# Patient Record
Sex: Female | Born: 1980 | Race: Black or African American | Hispanic: No | Marital: Single | Smoking: Never smoker
Health system: Southern US, Community
[De-identification: ages and names within clinical notes are randomized; demographics above are authoritative.]

## PROBLEM LIST (undated history)

## (undated) ENCOUNTER — Inpatient Hospital Stay (HOSPITAL_COMMUNITY): Payer: Self-pay

## (undated) DIAGNOSIS — R51 Headache: Secondary | ICD-10-CM

## (undated) DIAGNOSIS — B999 Unspecified infectious disease: Secondary | ICD-10-CM

## (undated) DIAGNOSIS — B54 Unspecified malaria: Secondary | ICD-10-CM

## (undated) DIAGNOSIS — D219 Benign neoplasm of connective and other soft tissue, unspecified: Secondary | ICD-10-CM

## (undated) DIAGNOSIS — K219 Gastro-esophageal reflux disease without esophagitis: Secondary | ICD-10-CM

## (undated) HISTORY — PX: BREAST SURGERY: SHX581

---

## 2003-02-07 ENCOUNTER — Encounter: Admission: RE | Admit: 2003-02-07 | Discharge: 2003-02-07 | Payer: Self-pay | Admitting: Specialist

## 2005-04-28 ENCOUNTER — Emergency Department: Payer: Self-pay | Admitting: Emergency Medicine

## 2005-09-22 ENCOUNTER — Emergency Department (HOSPITAL_COMMUNITY): Admission: EM | Admit: 2005-09-22 | Discharge: 2005-09-23 | Payer: Self-pay | Admitting: Emergency Medicine

## 2005-12-02 ENCOUNTER — Encounter: Admission: RE | Admit: 2005-12-02 | Discharge: 2005-12-02 | Payer: Self-pay | Admitting: Internal Medicine

## 2008-12-09 ENCOUNTER — Emergency Department (HOSPITAL_COMMUNITY): Admission: EM | Admit: 2008-12-09 | Discharge: 2008-12-09 | Payer: Self-pay | Admitting: Emergency Medicine

## 2009-03-21 ENCOUNTER — Encounter: Admission: RE | Admit: 2009-03-21 | Discharge: 2009-03-21 | Payer: Self-pay | Admitting: Internal Medicine

## 2011-07-10 ENCOUNTER — Encounter (HOSPITAL_COMMUNITY): Payer: Self-pay | Admitting: *Deleted

## 2011-07-10 ENCOUNTER — Inpatient Hospital Stay (HOSPITAL_COMMUNITY)
Admission: AD | Admit: 2011-07-10 | Discharge: 2011-07-11 | Disposition: A | Discharge status: Home / Self Care | Payer: Medicaid Other | Source: Ambulatory Visit | Attending: Obstetrics & Gynecology | Admitting: Obstetrics & Gynecology

## 2011-07-10 DIAGNOSIS — O26859 Spotting complicating pregnancy, unspecified trimester: Secondary | ICD-10-CM

## 2011-07-10 DIAGNOSIS — R109 Unspecified abdominal pain: Secondary | ICD-10-CM | POA: Insufficient documentation

## 2011-07-10 DIAGNOSIS — N949 Unspecified condition associated with female genital organs and menstrual cycle: Secondary | ICD-10-CM

## 2011-07-10 DIAGNOSIS — O26852 Spotting complicating pregnancy, second trimester: Secondary | ICD-10-CM

## 2011-07-10 DIAGNOSIS — R350 Frequency of micturition: Secondary | ICD-10-CM | POA: Insufficient documentation

## 2011-07-10 LAB — WET PREP, GENITAL
Clue Cells Wet Prep HPF POC: NONE SEEN
Trich, Wet Prep: NONE SEEN
Yeast Wet Prep HPF POC: NONE SEEN

## 2011-07-10 LAB — CBC
HCT: 33.3 % — ABNORMAL LOW (ref 36.0–46.0)
Hemoglobin: 11.3 g/dL — ABNORMAL LOW (ref 12.0–15.0)
MCH: 31 pg (ref 26.0–34.0)
MCHC: 33.9 g/dL (ref 30.0–36.0)
MCV: 91.5 fL (ref 78.0–100.0)
Platelets: 281 10*3/uL (ref 150–400)
RBC: 3.64 MIL/uL — ABNORMAL LOW (ref 3.87–5.11)
RDW: 13.2 % (ref 11.5–15.5)
WBC: 9 10*3/uL (ref 4.0–10.5)

## 2011-07-10 LAB — ABO/RH: ABO/RH(D): O POS

## 2011-07-10 NOTE — MAU Note (Signed)
Pt bleeding when she wiped at 2045. Pt also reports cramping which has ben going on for weeks. Pt also reports that she is constipated, but had normal BM yesterday.

## 2011-07-10 NOTE — MAU Note (Signed)
Pt states, " I've had constipation during my pregnancy; I went to the bathroom yesterday but it was hard. I've had cramping in my low abdomen for more than a week. I starting spotting tonight."

## 2011-07-10 NOTE — MAU Provider Note (Signed)
History     CSN: [id number]  Arrival date and time: 07/10/11 2121   First Provider Initiated Contact with Patient 07/10/11 2245      Chief Complaint  Patient presents with  . Vaginal Bleeding  . Abdominal Cramping   HPI 31 y.o. G1P0 at [redacted]w[redacted]d. Reports bleeding with wiping today, not heavy. Ongoing mild intermittent crampy pain. Has appt with Dr. Tamela Oddi next week.    Past Medical History  Diagnosis Date  . No pertinent past medical history     Past Surgical History  Procedure Date  . Breast surgery     R Breast    Family History  Problem Relation Age of Onset  . Anesthesia problems Neg Hx     History  Substance Use Topics  . Smoking status: Never Smoker   . Smokeless tobacco: Not on file  . Alcohol Use: No    Allergies: No Known Allergies  No prescriptions prior to admission    Review of Systems  Constitutional: Negative.   Respiratory: Negative.   Cardiovascular: Negative.   Gastrointestinal: Positive for abdominal pain. Negative for nausea, vomiting, diarrhea and constipation.  Genitourinary: Negative for dysuria, urgency, frequency, hematuria and flank pain.       Positive for vaginal bleeding   Musculoskeletal: Negative.   Neurological: Negative.   Psychiatric/Behavioral: Negative.    Physical Exam   Blood pressure 130/75, pulse 98, temperature 98.1 F (36.7 C), temperature source Oral, resp. rate 18, height 5' 5.5" (1.664 m), weight 153 lb 8 oz (69.627 kg), last menstrual period 03/12/2011.  Physical Exam  Nursing note and vitals reviewed. Constitutional: She is oriented to person, place, and time. She appears well-developed and well-nourished. No distress.  HENT:  Head: Normocephalic and atraumatic.  Cardiovascular: Normal rate.   Respiratory: Effort normal.  GI: Soft. There is no tenderness.  Genitourinary: There is no rash or lesion on the right labia. There is no rash or lesion on the left labia. Uterus is not tender. Enlarged:  Size c/w dates. Cervix exhibits no motion tenderness, no discharge and no friability. Right adnexum displays no mass, no tenderness and no fullness. Left adnexum displays no mass, no tenderness and no fullness. No tenderness or bleeding around the vagina. No vaginal discharge found.  Musculoskeletal: Normal range of motion.  Neurological: She is alert and oriented to person, place, and time.  Skin: Skin is warm and dry.  Psychiatric: She has a normal mood and affect.   + FHT MAU Course  Procedures  Results for orders placed during the hospital encounter of 07/10/11 (from the past 24 hour(s))  URINALYSIS, ROUTINE W REFLEX MICROSCOPIC     Status: Abnormal   Collection Time   07/10/11 10:00 PM      Component Value Range   Color, Urine YELLOW  YELLOW    APPearance CLEAR  CLEAR    Specific Gravity, Urine <1.005 (*) 1.005 - 1.030    pH 7.0  5.0 - 8.0    Glucose, UA NEGATIVE  NEGATIVE (mg/dL)   Hgb urine dipstick TRACE (*) NEGATIVE    Bilirubin Urine NEGATIVE  NEGATIVE    Ketones, ur NEGATIVE  NEGATIVE (mg/dL)   Protein, ur NEGATIVE  NEGATIVE (mg/dL)   Urobilinogen, UA 0.2  0.0 - 1.0 (mg/dL)   Nitrite NEGATIVE  NEGATIVE    Leukocytes, UA NEGATIVE  NEGATIVE   URINE MICROSCOPIC-ADD ON     Status: Abnormal   Collection Time   07/10/11 10:00 PM      Component  Value Range   Squamous Epithelial / LPF FEW (*) RARE    WBC, UA 0-2  <3 (WBC/hpf)   RBC / HPF    <3 (RBC/hpf)   Value: NO FORMED ELEMENTS SEEN ON URINE MICROSCOPIC EXAMINATION  CBC     Status: Abnormal   Collection Time   07/10/11 10:16 PM      Component Value Range   WBC 9.0  4.0 - 10.5 (K/uL)   RBC 3.64 (*) 3.87 - 5.11 (MIL/uL)   Hemoglobin 11.3 (*) 12.0 - 15.0 (g/dL)   HCT 16.1 (*) 09.6 - 46.0 (%)   MCV 91.5  78.0 - 100.0 (fL)   MCH 31.0  26.0 - 34.0 (pg)   MCHC 33.9  30.0 - 36.0 (g/dL)   RDW 04.5  40.9 - 81.1 (%)   Platelets 281  150 - 400 (K/uL)  ABO/RH     Status: Normal   Collection Time   07/10/11 10:16 PM       Component Value Range   ABO/RH(D) O POS    WET PREP, GENITAL     Status: Abnormal   Collection Time   07/10/11 10:45 PM      Component Value Range   Yeast Wet Prep HPF POC NONE SEEN  NONE SEEN    Trich, Wet Prep NONE SEEN  NONE SEEN    Clue Cells Wet Prep HPF POC NONE SEEN  NONE SEEN    WBC, Wet Prep HPF POC MODERATE (*) NONE SEEN      Assessment and Plan  31 y.o. G1P0 at [redacted]w[redacted]d Bleeding in pregnancy GC/CT pending Care assumed by Ivonne Andrew, CNM   FRAZIER,NATALIE 07/10/2011, 10:45 PM   Care of pt assumed at 2300  No further bleeding. Mild, bilat, short, sharp groin pains w/ mvmt.  1. Spotting complicating pregnancy in second trimester   2. Frequency of urination   3. Round ligament pain    Follow-up Information    Follow up with [facility] on 07/17/2011. (or MAU as needed if symptoms worsen)    Contact information:   [address] [place] [address]         Medication List  As of 07/11/2011 12:31 AM   CONTINUE taking these medications         acetaminophen 500 MG tablet   Commonly known as: TYLENOL      prenatal multivitamin Tabs           Will culture urine due to reports of frequency. Bleeding precautions Pelvic rest until NOB appointment.  Dorathy Kinsman 07/11/2011 12:32 AM

## 2011-07-11 LAB — URINE MICROSCOPIC-ADD ON: RBC / HPF: NONE SEEN RBC/hpf (ref <3)

## 2011-07-11 LAB — URINALYSIS, ROUTINE W REFLEX MICROSCOPIC
Bilirubin Urine: NEGATIVE
Glucose, UA: NEGATIVE mg/dL
Ketones, ur: NEGATIVE mg/dL
Leukocytes, UA: NEGATIVE
Nitrite: NEGATIVE
Protein, ur: NEGATIVE mg/dL
Specific Gravity, Urine: <1.005 — ABNORMAL LOW (ref 1.005–1.030)
Urobilinogen, UA: 0.2 mg/dL (ref 0.0–1.0)
pH: 7 (ref 5.0–8.0)

## 2011-07-11 NOTE — MAU Provider Note (Signed)
Medical Screening exam and patient care preformed by advanced practice provider.  Agree with the above management.  

## 2011-07-12 LAB — URINE CULTURE
Colony Count: 40000
Culture  Setup Time: 201304270547
Special Requests: NORMAL

## 2011-07-13 LAB — GC/CHLAMYDIA PROBE AMP, GENITAL
Chlamydia, DNA Probe: NEGATIVE
GC Probe Amp, Genital: NEGATIVE

## 2011-07-17 ENCOUNTER — Other Ambulatory Visit: Payer: Self-pay | Admitting: Obstetrics & Gynecology

## 2011-07-17 DIAGNOSIS — Z3689 Encounter for other specified antenatal screening: Secondary | ICD-10-CM

## 2011-07-21 ENCOUNTER — Ambulatory Visit (HOSPITAL_COMMUNITY)
Admission: RE | Admit: 2011-07-21 | Discharge: 2011-07-21 | Disposition: A | Discharge status: Home / Self Care | Payer: Medicaid Other | Source: Ambulatory Visit | Attending: Obstetrics & Gynecology | Admitting: Obstetrics & Gynecology

## 2011-07-21 DIAGNOSIS — Z363 Encounter for antenatal screening for malformations: Secondary | ICD-10-CM | POA: Insufficient documentation

## 2011-07-21 DIAGNOSIS — O358XX0 Maternal care for other (suspected) fetal abnormality and damage, not applicable or unspecified: Secondary | ICD-10-CM | POA: Insufficient documentation

## 2011-07-21 DIAGNOSIS — Z1389 Encounter for screening for other disorder: Secondary | ICD-10-CM | POA: Insufficient documentation

## 2011-07-21 DIAGNOSIS — Z3689 Encounter for other specified antenatal screening: Secondary | ICD-10-CM

## 2011-07-28 ENCOUNTER — Encounter: Payer: Self-pay | Admitting: Advanced Practice Midwife

## 2011-07-28 DIAGNOSIS — B951 Streptococcus, group B, as the cause of diseases classified elsewhere: Secondary | ICD-10-CM

## 2011-07-28 DIAGNOSIS — O2340 Unspecified infection of urinary tract in pregnancy, unspecified trimester: Secondary | ICD-10-CM | POA: Insufficient documentation

## 2011-07-28 MED ORDER — AMOXICILLIN 875 MG PO TABS: 875.0000 mg | ORAL_TABLET | Freq: Two times a day (BID) | ORAL | Status: AC

## 2011-09-07 ENCOUNTER — Inpatient Hospital Stay (HOSPITAL_COMMUNITY)
Admission: AD | Admit: 2011-09-07 | Discharge: 2011-09-07 | Disposition: A | Discharge status: Home / Self Care | Payer: Medicaid Other | Source: Ambulatory Visit | Attending: Obstetrics | Admitting: Obstetrics

## 2011-09-07 ENCOUNTER — Inpatient Hospital Stay (HOSPITAL_COMMUNITY): Payer: Medicaid Other

## 2011-09-07 ENCOUNTER — Encounter (HOSPITAL_COMMUNITY): Payer: Self-pay | Admitting: *Deleted

## 2011-09-07 DIAGNOSIS — Z331 Pregnant state, incidental: Secondary | ICD-10-CM

## 2011-09-07 DIAGNOSIS — N930 Postcoital and contact bleeding: Secondary | ICD-10-CM

## 2011-09-07 DIAGNOSIS — O209 Hemorrhage in early pregnancy, unspecified: Secondary | ICD-10-CM | POA: Insufficient documentation

## 2011-09-07 HISTORY — DX: Benign neoplasm of connective and other soft tissue, unspecified: D21.9

## 2011-09-07 NOTE — MAU Provider Note (Signed)
Chief Complaint:  Vaginal Bleeding    First Provider Initiated Contact with Patient 09/07/11 1627      Shirleyann Widrig is  31 y.o. G1P0000.  Patient's last menstrual period was 03/12/2011..  [redacted]w[redacted]d  She presents complaining of Vaginal Bleeding . Onset is described as sudden and has been present for  3 hours. Reports intercourse last night. Noticed bright red vaginal bleeding after voiding today. Denies abd pain, LOF, dysuria or back pain. + FM  Obstetrical/Gynecological History: OB History    Grav Para Term Preterm Abortions TAB SAB Ect Mult Living   1 0 0 0 0 0 0 0 0 0       Past Medical History: Past Medical History  Diagnosis Date  . Fibroid     4CM anterior    Past Surgical History: Past Surgical History  Procedure Date  . Breast surgery     R Breast    Family History: Family History  Problem Relation Age of Onset  . Anesthesia problems Neg Hx     Social History: History  Substance Use Topics  . Smoking status: Never Smoker   . Smokeless tobacco: Never Used  . Alcohol Use: No    Allergies: No Known Allergies  Prescriptions prior to admission  Medication Sig Dispense Refill  . acetaminophen (TYLENOL) 500 MG tablet Take 500 mg by mouth every 6 (six) hours as needed. For pain, headache      . lansoprazole (PREVACID) 15 MG capsule Take 15 mg by mouth daily. Acid reflux      . Prenatal Vit-Fe Fumarate-FA (PRENATAL MULTIVITAMIN) TABS Take 1 tablet by mouth daily.      . promethazine (PHENERGAN) 25 MG tablet Take 25 mg by mouth every 6 (six) hours as needed. nausea        Review of Systems - History obtained from the patient General ROS: negative for - chills or fever Respiratory ROS: no cough, shortness of breath, or wheezing Cardiovascular ROS: no chest pain or dyspnea on exertion Gastrointestinal ROS: no abdominal pain, change in bowel habits, or black or bloody stools Genito-Urinary ROS: no dysuria, trouble voiding, or hematuria positive for - vaginal  bleeding  Physical Exam   Blood pressure 123/65, pulse 96, temperature 97.8 F (36.6 C), temperature source Oral, resp. rate 20, height 5\' 6"  (1.676 m), weight 159 lb (72.122 kg), last menstrual period 03/12/2011.  General: General appearance - alert, well appearing, and in no distress and oriented to person, place, and time Mental status - alert, oriented to person, place, and time, normal mood, behavior, speech, dress, motor activity, and thought processes, anxious Abdomen - gravid non tender Focused Gynecological Exam: VULVA: normal appearing vulva with no masses, tenderness or lesions, VAGINA: normal appearing vagina with normal color and discharge, no lesions, atrophic, small amount of BRB cleared from vault, CERVIX: friable to touch, hemostatic with pressure, closes/thick/firm/long  Labs: No results found for this or any previous visit (from the past 24 hour(s)). Imaging Studies:  US Ob Limited  09/07/2011  *RADIOLOGY REPORT*  Clinical Data: Vaginal bleeding.  Fibroids.  25-week-4-day assigned gestational age.  LIMITED OBSTETRIC ULTRASOUND  Number of Fetuses: 1 Heart Rate: 152 bpm Movement: Yes Presentation: Cephalic Placental Location: Fundal Previa: No Amniotic Fluid (Subjective): Within normal limits  Vertical pocket:  4.8cm  BPD: Six pointcm   25w   4d   EDC: 12/17/2011  MATERNAL FINDINGS: Cervix: 4.7 cm; appears closed Uterus/Adnexae: Anterior fibroid again seen, measuring 4.0 cm in maximum diameter.  IMPRESSION:  1.  Single living intrauterine fetus in cephalic presentation. 2.  Normal amniotic fluid volume. 3.  No evidence of placental abruption or previa. 4.  4 cm anterior fibroid again noted.  Original Report Authenticated By: Danae Orleans, M.D.   MD Consult: discussed with Dr. Gaynell Face  Assessment: 1. Post - coital bleeding   2. Pregnant state, incidental    Plan: Discharge home Precautions reviewed FU with Dr. Clearance Coots in 1 week  Codylee Patil E. 09/07/2011,6:07 PM

## 2011-09-07 NOTE — MAU Note (Addendum)
Noted sudden onset of vaginal bleeding around 1500. Has not occurred before. Denies previa or low lying placenta. States she had intercourse last night. Denies pain.

## 2011-09-07 NOTE — Discharge Instructions (Signed)
Vaginal Bleeding During Pregnancy, Second Trimester  A small amount of bleeding (spotting) is relatively common in pregnancy. It usually stops on its own. There are many causes for bleeding or spotting in pregnancy. Some bleeding may be related to the pregnancy and some may not. Cramping with the bleeding is more serious and concerning. Tell your caregiver if you have any vaginal bleeding.   CAUSES    Infection, inflammation or growths on the cervix.   The placenta may partially or completely be covering the opening of the cervix inside the uterus.   The placenta may have separated from the uterus.   You may be having early/preterm labor.   The cervix is not strong enough to keep a baby inside the uterus (cervical insufficiency).   Many tiny cysts in the uterus instead of pregnancy tissue (molar pregnancy)  SYMPTOMS    Vaginal spotting or bleeding with or without cramps.   Uterine contractions.   Abnormal vaginal discharge.   You may have spotting or spotting after having sexual intercourse.  DIAGNOSIS   To evaluate the pregnancy, your caregiver may:   Do a pelvic exam.   Take blood tests.   Do an ultrasound.  It is very important to follow your caregiver's instructions.   TREATMENT    Evaluation of the pregnancy with blood tests and ultrasound.   Bed rest (getting up to use the bathroom only).   Rho-gam immunization if the mother is Rh negative and the father is Rh positive.   If you are having uterine contractions, you may be given medication to stop the contractions.   If you have cervical insufficiency, you may have a suture placed in the cervix to close it.  HOME CARE INSTRUCTIONS    If your caregiver orders bed rest, you may need to make arrangements for the care of other children and for any other responsibilities. However, your caregiver may allow you to continue light activity.   Keep track of the number of pads you use each day and how soaked (saturated) they are. Write this down.   Do  not use tampons. Do not douche.   Do not have sexual intercourse or orgasms until approved by your physician.   Save any tissue that you pass for your caregiver to see.   Take medicine for cramps only with your caregiver's permission.   Do not take aspirin because it can make you bleed.   Do not exercise, do any strenuous activities or heavy lifting without your caregiver's permission.  SEEK IMMEDIATE MEDICAL CARE IF:    You experience severe cramps in your stomach, back or belly (abdomen).   You have uterine contractions.   You have an oral temperature above 102 F (38.9 C), not controlled by medicine.   You develop chills.   You pass large clots or tissue.   Your bleeding increases or you become light-headed, weak or have fainting episodes.   You have leaking or a gush of fluid from your vagina.  Document Released: 12/10/2004 Document Revised: 02/19/2011 Document Reviewed: 06/21/2008  [employer] Patient Information 2012 [employer].

## 2011-10-15 ENCOUNTER — Other Ambulatory Visit: Payer: Self-pay | Admitting: Obstetrics

## 2011-10-15 DIAGNOSIS — Z09 Encounter for follow-up examination after completed treatment for conditions other than malignant neoplasm: Secondary | ICD-10-CM

## 2011-10-15 DIAGNOSIS — O365990 Maternal care for other known or suspected poor fetal growth, unspecified trimester, not applicable or unspecified: Secondary | ICD-10-CM

## 2011-10-16 ENCOUNTER — Ambulatory Visit (HOSPITAL_COMMUNITY)
Admission: RE | Admit: 2011-10-16 | Discharge: 2011-10-16 | Disposition: A | Discharge status: Home / Self Care | Payer: Medicaid Other | Source: Ambulatory Visit | Attending: Obstetrics | Admitting: Obstetrics

## 2011-10-16 DIAGNOSIS — Z3689 Encounter for other specified antenatal screening: Secondary | ICD-10-CM | POA: Insufficient documentation

## 2011-10-16 DIAGNOSIS — Z09 Encounter for follow-up examination after completed treatment for conditions other than malignant neoplasm: Secondary | ICD-10-CM

## 2011-12-22 ENCOUNTER — Inpatient Hospital Stay (HOSPITAL_COMMUNITY)
Admission: AD | Admit: 2011-12-22 | Discharge: 2011-12-27 | DRG: 766 | Disposition: A | Discharge status: Home / Self Care | Payer: Medicaid Other | Source: Ambulatory Visit | Attending: Obstetrics | Admitting: Obstetrics

## 2011-12-22 ENCOUNTER — Encounter (HOSPITAL_COMMUNITY): Payer: Self-pay | Admitting: *Deleted

## 2011-12-22 DIAGNOSIS — O4290 Premature rupture of membranes, unspecified as to length of time between rupture and onset of labor, unspecified weeks of gestation: Secondary | ICD-10-CM | POA: Diagnosis present

## 2011-12-22 DIAGNOSIS — B951 Streptococcus, group B, as the cause of diseases classified elsewhere: Secondary | ICD-10-CM

## 2011-12-22 DIAGNOSIS — Z22330 Carrier of Group B streptococcus: Secondary | ICD-10-CM

## 2011-12-22 DIAGNOSIS — O99892 Other specified diseases and conditions complicating childbirth: Secondary | ICD-10-CM | POA: Diagnosis present

## 2011-12-22 HISTORY — DX: Unspecified malaria: B54

## 2011-12-22 HISTORY — DX: Unspecified infectious disease: B99.9

## 2011-12-22 HISTORY — DX: Headache: R51

## 2011-12-22 HISTORY — DX: Gastro-esophageal reflux disease without esophagitis: K21.9

## 2011-12-22 LAB — TYPE AND SCREEN
ABO/RH(D): O POS
Antibody Screen: NEGATIVE

## 2011-12-22 LAB — CBC
MCH: 31.6 pg (ref 26.0–34.0)
MCV: 93 fL (ref 78.0–100.0)
Platelets: 292 10*3/uL (ref 150–400)
RBC: 4.31 MIL/uL (ref 3.87–5.11)
RDW: 13.2 % (ref 11.5–15.5)
WBC: 9.3 10*3/uL (ref 4.0–10.5)

## 2011-12-22 LAB — COMPREHENSIVE METABOLIC PANEL
Albumin: 3.1 g/dL — ABNORMAL LOW (ref 3.5–5.2)
Alkaline Phosphatase: 124 U/L — ABNORMAL HIGH (ref 39–117)
BUN: 10 mg/dL (ref 6–23)
Calcium: 9.2 mg/dL (ref 8.4–10.5)
GFR calc Af Amer: >90 mL/min (ref >90)
Glucose, Bld: 73 mg/dL (ref 70–99)
Potassium: 4.2 mEq/L (ref 3.5–5.1)
Total Protein: 6.7 g/dL (ref 6.0–8.3)

## 2011-12-22 LAB — LACTATE DEHYDROGENASE: LDH: 218 U/L (ref 94–250)

## 2011-12-22 LAB — URIC ACID: Uric Acid, Serum: 5.4 mg/dL (ref 2.4–7.0)

## 2011-12-22 LAB — POCT FERN TEST

## 2011-12-22 LAB — RPR: RPR Ser Ql: NONREACTIVE

## 2011-12-22 MED ORDER — PENICILLIN G POTASSIUM 5000000 UNITS IJ SOLR
2.5000 10*6.[IU] | INTRAVENOUS | Status: DC
  Administered 2011-12-22 – 2011-12-24 (×10): 2.5 10*6.[IU] via INTRAVENOUS
  Filled 2011-12-22 (×13): qty 2.5

## 2011-12-22 MED ORDER — ONDANSETRON HCL 4 MG/2ML IJ SOLN: 4.0000 mg | Freq: Four times a day (QID) | INTRAMUSCULAR | Status: DC | PRN

## 2011-12-22 MED ORDER — LACTATED RINGERS IV SOLN
INTRAVENOUS | Status: DC
  Administered 2011-12-22 – 2011-12-24 (×5): via INTRAVENOUS

## 2011-12-22 MED ORDER — CITRIC ACID-SODIUM CITRATE 334-500 MG/5ML PO SOLN
30.0000 mL | ORAL | Status: DC | PRN
  Administered 2011-12-24: 30 mL via ORAL

## 2011-12-22 MED ORDER — OXYTOCIN 40 UNITS IN LACTATED RINGERS INFUSION - SIMPLE MED
1.0000 m[IU]/min | INTRAVENOUS | Status: DC
  Administered 2011-12-22: 2 m[IU]/min via INTRAVENOUS
  Filled 2011-12-22: qty 1000

## 2011-12-22 MED ORDER — PENICILLIN G POTASSIUM 5000000 UNITS IJ SOLR
5.0000 10*6.[IU] | Freq: Once | INTRAVENOUS | Status: AC
  Administered 2011-12-22: 5 10*6.[IU] via INTRAVENOUS
  Filled 2011-12-22: qty 5

## 2011-12-22 MED ORDER — LIDOCAINE HCL (PF) 1 % IJ SOLN: 30.0000 mL | INTRAMUSCULAR | Status: DC | PRN

## 2011-12-22 MED ORDER — TERBUTALINE SULFATE 1 MG/ML IJ SOLN: 0.2500 mg | Freq: Once | INTRAMUSCULAR | Status: AC | PRN

## 2011-12-22 MED ORDER — ACETAMINOPHEN 325 MG PO TABS: 650.0000 mg | ORAL_TABLET | ORAL | Status: DC | PRN

## 2011-12-22 MED ORDER — OXYCODONE-ACETAMINOPHEN 5-325 MG PO TABS
1.0000 | ORAL_TABLET | ORAL | Status: DC | PRN
Start: 2011-12-22 — End: 2011-12-24

## 2011-12-22 MED ORDER — IBUPROFEN 600 MG PO TABS: 600.0000 mg | ORAL_TABLET | Freq: Four times a day (QID) | ORAL | Status: DC | PRN

## 2011-12-22 MED ORDER — LACTATED RINGERS IV SOLN
500.0000 mL | INTRAVENOUS | Status: DC | PRN
Start: 2011-12-22 — End: 2011-12-24

## 2011-12-22 MED ORDER — OXYTOCIN BOLUS FROM INFUSION
500.0000 mL | Freq: Once | INTRAVENOUS | Status: DC
  Filled 2011-12-22: qty 500

## 2011-12-22 MED ORDER — OXYTOCIN 40 UNITS IN LACTATED RINGERS INFUSION - SIMPLE MED: 62.5000 mL/h | Freq: Once | INTRAVENOUS | Status: DC

## 2011-12-22 NOTE — MAU Note (Signed)
Dr. Clearance Coots notified of pt in MAU for eval of ?SROM, +fern, GBS +, cervix 1.5/60/-2 vertex, orders to admit, Dr. Clearance Coots to place orders for PCN protocol.

## 2011-12-22 NOTE — MAU Note (Signed)
Clear fluid first noted at 0800, no bleeding.  First preg, uncomplicated.  Rare pains, about every 3 hrs past couple days.  Was 2 cm last wk.

## 2011-12-22 NOTE — MAU Note (Signed)
Dr. Clearance Coots notified pt had elevated b/p's earlier in MAU, b/p's reported by RN, Dr. Clearance Coots gave orders for [facility] labs. CMandeleras, RN notified, will pass on to RN providing care for pt.

## 2011-12-22 NOTE — H&P (Signed)
Courtney Johns is a 31 y.o. female presenting for ROM. Maternal Medical History:  Reason for admission: Reason for admission: rupture of membranes and contractions.  31 yo G1.  EDC 12-17-11.  Presents with c/o leaking clear fluid since 0800.  Irregular UC's.  GBS +  Contractions: Onset was 3-5 hours ago.   Frequency: irregular.    Fetal activity: Perceived fetal activity is normal.   Last perceived fetal movement was within the past hour.    Prenatal complications: no prenatal complications Prenatal Complications - Diabetes: none.    OB History    Grav Para Term Preterm Abortions TAB SAB Ect Mult Living   1 0 0 0 0 0 0 0 0 0      Past Medical History  Diagnosis Date  . Fibroid     4CM anterior  . Headache   . GERD (gastroesophageal reflux disease)   . Infection     urinary tract infection   Past Surgical History  Procedure Date  . Breast surgery     R Breast   Family History: family history is negative for Anesthesia problems and Other. Social History:  reports that she has never smoked. She has never used smokeless tobacco. She reports that she does not drink alcohol or use illicit drugs.   Prenatal Transfer Tool  Maternal Diabetes: No Genetic Screening: Normal Maternal Ultrasounds/Referrals: Normal Fetal Ultrasounds or other Referrals:  None Maternal Substance Abuse:  No Significant Maternal Medications:  Meds include: Other:       PNV                                                                                                                                                                                                             Significant Maternal Lab Results:  Lab values include: Group B Strep positive Other Comments:  None  Review of Systems  All other systems reviewed and are negative.      Blood pressure 131/95, pulse 86, temperature 97.1 F (36.2 C), temperature source Oral, resp. rate 20, last menstrual period 03/12/2011. Maternal Exam:  Uterine  Assessment: Contraction strength is mild.  Abdomen: Patient reports no abdominal tenderness. Fetal presentation: vertex  Introitus: Normal vulva. Normal vagina.  Pelvis: adequate for delivery.   Cervix: Cervix evaluated by digital exam.     Physical Exam  Nursing note and vitals reviewed. Constitutional: She is oriented to person, place, and time. She appears well-developed and well-nourished.  HENT:  Head: Normocephalic and atraumatic.  Eyes: Conjunctivae normal are normal. Pupils are equal, round,  and reactive to light.  Neck: Normal range of motion. Neck supple.  Cardiovascular: Normal rate and regular rhythm.   Respiratory: Effort normal.  GI: Soft.  Genitourinary: Vagina normal and uterus normal.  Musculoskeletal: Normal range of motion.  Neurological: She is alert and oriented to person, place, and time.  Skin: Skin is warm and dry.  Psychiatric: She has a normal mood and affect. Her behavior is normal. Judgment and thought content normal.    Prenatal labs: ABO, Rh: --/--/O POS (04/26 2216) Antibody:   Rubella:   RPR:    HBsAg:    HIV:    GBS:     Assessment/Plan: 40.5 weeks.  SROM.  Latent phase of labor.   Eivin Mascio A 12/22/2011, 11:55 AM

## 2011-12-22 NOTE — MAU Note (Signed)
Pt states srom at 0800, clear fluid. Was dilated 2cm last Wednesday.

## 2011-12-23 DIAGNOSIS — O4290 Premature rupture of membranes, unspecified as to length of time between rupture and onset of labor, unspecified weeks of gestation: Secondary | ICD-10-CM | POA: Diagnosis present

## 2011-12-23 LAB — OB RESULTS CONSOLE RUBELLA ANTIBODY, IGM: Rubella: IMMUNE

## 2011-12-23 MED ORDER — NALBUPHINE SYRINGE 5 MG/0.5 ML
10.0000 mg | INJECTION | Freq: Four times a day (QID) | INTRAMUSCULAR | Status: DC | PRN
  Administered 2011-12-23 – 2011-12-24 (×2): 10 mg via INTRAMUSCULAR
  Filled 2011-12-23 (×2): qty 1

## 2011-12-23 MED ORDER — EPHEDRINE 5 MG/ML INJ: 10.0000 mg | INTRAVENOUS | Status: DC | PRN

## 2011-12-23 MED ORDER — EPHEDRINE 5 MG/ML INJ
10.0000 mg | INTRAVENOUS | Status: DC | PRN
  Filled 2011-12-23: qty 4

## 2011-12-23 MED ORDER — LACTATED RINGERS IV SOLN: 500.0000 mL | Freq: Once | INTRAVENOUS | Status: DC

## 2011-12-23 MED ORDER — INFLUENZA VIRUS VACC SPLIT PF IM SUSP: 0.5000 mL | INTRAMUSCULAR | Status: DC | PRN

## 2011-12-23 MED ORDER — PHENYLEPHRINE 40 MCG/ML (10ML) SYRINGE FOR IV PUSH (FOR BLOOD PRESSURE SUPPORT)
80.0000 ug | PREFILLED_SYRINGE | INTRAVENOUS | Status: DC | PRN
  Filled 2011-12-23: qty 5

## 2011-12-23 MED ORDER — PROMETHAZINE HCL 25 MG/ML IJ SOLN
25.0000 mg | Freq: Four times a day (QID) | INTRAMUSCULAR | Status: DC | PRN
  Administered 2011-12-23: 25 mg via INTRAMUSCULAR
  Filled 2011-12-23: qty 1

## 2011-12-23 MED ORDER — DIPHENHYDRAMINE HCL 50 MG/ML IJ SOLN: 12.5000 mg | INTRAMUSCULAR | Status: DC | PRN

## 2011-12-23 MED ORDER — FENTANYL 2.5 MCG/ML BUPIVACAINE 1/10 % EPIDURAL INFUSION (WH - ANES)
14.0000 mL/h | INTRAMUSCULAR | Status: DC
  Administered 2011-12-24: 14 mL/h via EPIDURAL
  Filled 2011-12-23 (×2): qty 125

## 2011-12-23 MED ORDER — PHENYLEPHRINE 40 MCG/ML (10ML) SYRINGE FOR IV PUSH (FOR BLOOD PRESSURE SUPPORT): 80.0000 ug | PREFILLED_SYRINGE | INTRAVENOUS | Status: DC | PRN

## 2011-12-23 MED ORDER — OXYTOCIN 40 UNITS IN LACTATED RINGERS INFUSION - SIMPLE MED
1.0000 m[IU]/min | INTRAVENOUS | Status: DC
  Administered 2011-12-23: 2 m[IU]/min via INTRAVENOUS
  Filled 2011-12-23: qty 1000

## 2011-12-23 MED ORDER — NALBUPHINE SYRINGE 5 MG/0.5 ML
10.0000 mg | INJECTION | INTRAMUSCULAR | Status: DC | PRN
Start: 2011-12-23 — End: 2011-12-24
  Administered 2011-12-23: 10 mg via INTRAVENOUS
  Filled 2011-12-23 (×2): qty 1

## 2011-12-23 NOTE — Progress Notes (Signed)
Called Dr Tamela Oddi to update that pt thinking about epidural for pain mgmt. Cervical exam unchanged, will be on Pitocin 24 hours @ 1830. D/c Pitocin then restart x 2 hours. Pt may have epidural.

## 2011-12-23 NOTE — Progress Notes (Signed)
Called Dr Jackson Moore to update that pt thinking about epidural for pain mgmt. Cervical exam unchanged, will be on Pitocin 24 hours @ 1830. D/c Pitocin then restart x 2 hours. Pt may have epidural.  

## 2011-12-23 NOTE — Progress Notes (Signed)
Courtney Johns is a 31 y.o. G1P0000 at [redacted]w[redacted]d by LMP admitted for PROM  Subjective: Comfortable  Objective: BP 118/80  Pulse 93  Temp 97.7 F (36.5 C) (Oral)  Resp 18  Ht 5\' 6"  (1.676 m)  Wt 172 lb (78.019 kg)  BMI 27.76 kg/m2  LMP 03/12/2011      FHT:  FHR: 140 bpm, variability: moderate,  accelerations:  Present,  decelerations:  Absent UC:   irregular, every 5 minutes SVE:   Dilation: 1 Effacement (%): 50;60 Station: -2 Exam by:: R Simpson RN  Labs: Lab Results  Component Value Date   WBC 9.3 12/22/2011   HGB 13.6 12/22/2011   HCT 40.1 12/22/2011   MCV 93.0 12/22/2011   PLT 292 12/22/2011    Assessment / Plan: Protracted latent phase  Labor: see above Preeclampsia:  n/a Fetal Wellbeing:  Category I Pain Control:  Labor support without medications I/D:  n/a Anticipated MOD:  NSVD  JACKSON-MOORE,Elenor Wildes A 12/23/2011, 8:28 PM

## 2011-12-23 NOTE — Anesthesia Preprocedure Evaluation (Signed)
Anesthesia Evaluation  Patient identified by MRN, date of birth, ID band Patient awake    Reviewed: Allergy & Precautions, H&P , Patient's Chart, lab work & pertinent test results  Airway Mallampati: II  TM Distance: >3 FB Neck ROM: full    Dental  (+) Teeth Intact   Pulmonary  breath sounds clear to auscultation        Cardiovascular Rhythm:regular Rate:Normal     Neuro/Psych    GI/Hepatic GERD-  ,  Endo/Other    Renal/GU      Musculoskeletal   Abdominal   Peds  Hematology   Anesthesia Other Findings       Reproductive/Obstetrics (+) Pregnancy                             Anesthesia Physical Anesthesia Plan  ASA: II  Anesthesia Plan: Epidural   Post-op Pain Management:    Induction:   Airway Management Planned:   Additional Equipment:   Intra-op Plan:   Post-operative Plan:   Informed Consent: I have reviewed the patients History and Physical, chart, labs and discussed the procedure including the risks, benefits and alternatives for the proposed anesthesia with the patient or authorized representative who has indicated his/her understanding and acceptance.   Dental Advisory Given  Plan Discussed with:   Anesthesia Plan Comments: (Labs checked- platelets confirmed with RN in room. Fetal heart tracing, per RN, reported to be stable enough for sitting procedure. Discussed epidural, and patient consents to the procedure:  included risk of possible headache,backache, failed block, allergic reaction, and nerve injury. This patient was asked if she had any questions or concerns before the procedure started.)        Anesthesia Quick Evaluation  

## 2011-12-23 NOTE — Progress Notes (Signed)
Aruna Manzella is a 31 y.o. G1P0000 at [redacted]w[redacted]d by LMP admitted for rupture of membranes  Subjective: Comfortable  Objective: BP 116/73  Pulse 94  Temp 97.9 F (36.6 C) (Oral)  Resp 16  Ht 5\' 6"  (1.676 m)  Wt 172 lb (78.019 kg)  BMI 27.76 kg/m2  LMP 03/12/2011      FHT:  FHR: 140 bpm, variability: moderate,  accelerations:  Present,  decelerations:  Absent UC:   irregular, every 10 minutes SVE:   Dilation: 1.5 Effacement (%): 50 Station: -2 Exam by:: a. harper, rnc-ob  Labs: Lab Results  Component Value Date   WBC 9.3 12/22/2011   HGB 13.6 12/22/2011   HCT 40.1 12/22/2011   MCV 93.0 12/22/2011   PLT 292 12/22/2011    Assessment / Plan: PROM, prodromal labor  Labor: See above Preeclampsia:  n/a Fetal Wellbeing:  Category I Pain Control:  Stadol I/D:  no signs/sxs of chorioamnionitis Anticipated MOD:  NSVD  JACKSON-MOORE,Letitia Sabala A 12/23/2011, 9:24 AM

## 2011-12-24 ENCOUNTER — Encounter (HOSPITAL_COMMUNITY)
Admission: AD | Disposition: A | Discharge status: Home / Self Care | Payer: Self-pay | Source: Ambulatory Visit | Attending: Obstetrics

## 2011-12-24 ENCOUNTER — Encounter (HOSPITAL_COMMUNITY): Payer: Self-pay | Admitting: Anesthesiology

## 2011-12-24 ENCOUNTER — Inpatient Hospital Stay (HOSPITAL_COMMUNITY): Payer: Medicaid Other | Admitting: Anesthesiology

## 2011-12-24 ENCOUNTER — Encounter (HOSPITAL_COMMUNITY): Payer: Self-pay | Admitting: Obstetrics

## 2011-12-24 SURGERY — Surgical Case
Anesthesia: Regional | Site: Abdomen | Wound class: Clean Contaminated

## 2011-12-24 MED ORDER — LACTATED RINGERS IV SOLN
INTRAVENOUS | Status: DC | PRN
  Administered 2011-12-24 (×2): via INTRAVENOUS

## 2011-12-24 MED ORDER — WITCH HAZEL-GLYCERIN EX PADS: 1.0000 "application " | MEDICATED_PAD | CUTANEOUS | Status: DC | PRN

## 2011-12-24 MED ORDER — LIDOCAINE-EPINEPHRINE (PF) 2 %-1:200000 IJ SOLN
INTRAMUSCULAR | Status: AC
  Filled 2011-12-24: qty 20

## 2011-12-24 MED ORDER — ONDANSETRON HCL 4 MG PO TABS: 4.0000 mg | ORAL_TABLET | ORAL | Status: DC | PRN

## 2011-12-24 MED ORDER — ONDANSETRON HCL 4 MG/2ML IJ SOLN: 4.0000 mg | Freq: Three times a day (TID) | INTRAMUSCULAR | Status: DC | PRN

## 2011-12-24 MED ORDER — DIPHENHYDRAMINE HCL 25 MG PO CAPS
25.0000 mg | ORAL_CAPSULE | ORAL | Status: DC | PRN
  Administered 2011-12-25 (×2): 25 mg via ORAL
  Filled 2011-12-24 (×2): qty 1

## 2011-12-24 MED ORDER — CITRIC ACID-SODIUM CITRATE 334-500 MG/5ML PO SOLN
ORAL | Status: AC
  Administered 2011-12-24: 30 mL via ORAL
  Filled 2011-12-24: qty 15

## 2011-12-24 MED ORDER — OXYTOCIN 10 UNIT/ML IJ SOLN
40.0000 [IU] | INTRAVENOUS | Status: DC | PRN
  Administered 2011-12-24: 40 [IU] via INTRAVENOUS

## 2011-12-24 MED ORDER — ONDANSETRON HCL 4 MG/2ML IJ SOLN
INTRAMUSCULAR | Status: AC
  Filled 2011-12-24: qty 2

## 2011-12-24 MED ORDER — LACTATED RINGERS IV SOLN: INTRAVENOUS | Status: DC

## 2011-12-24 MED ORDER — MORPHINE SULFATE 0.5 MG/ML IJ SOLN
INTRAMUSCULAR | Status: AC
  Filled 2011-12-24: qty 10

## 2011-12-24 MED ORDER — NALBUPHINE HCL 10 MG/ML IJ SOLN
5.0000 mg | INTRAMUSCULAR | Status: DC | PRN
  Administered 2011-12-25: 10 mg via SUBCUTANEOUS
  Filled 2011-12-24: qty 1

## 2011-12-24 MED ORDER — CEFAZOLIN SODIUM-DEXTROSE 2-3 GM-% IV SOLR
INTRAVENOUS | Status: DC | PRN
  Administered 2011-12-24: 2 g via INTRAVENOUS

## 2011-12-24 MED ORDER — DIPHENHYDRAMINE HCL 50 MG/ML IJ SOLN: 12.5000 mg | INTRAMUSCULAR | Status: DC | PRN

## 2011-12-24 MED ORDER — SODIUM CHLORIDE 0.9 % IV SOLN
1.0000 ug/kg/h | INTRAVENOUS | Status: DC | PRN
  Filled 2011-12-24: qty 2.5

## 2011-12-24 MED ORDER — DIBUCAINE 1 % RE OINT: 1.0000 "application " | TOPICAL_OINTMENT | RECTAL | Status: DC | PRN

## 2011-12-24 MED ORDER — PRENATAL MULTIVITAMIN CH
1.0000 | ORAL_TABLET | Freq: Every day | ORAL | Status: DC
  Administered 2011-12-25 – 2011-12-27 (×3): 1 via ORAL
  Filled 2011-12-24 (×3): qty 1

## 2011-12-24 MED ORDER — DIPHENHYDRAMINE HCL 50 MG/ML IJ SOLN: 25.0000 mg | INTRAMUSCULAR | Status: DC | PRN

## 2011-12-24 MED ORDER — SENNOSIDES-DOCUSATE SODIUM 8.6-50 MG PO TABS
2.0000 | ORAL_TABLET | Freq: Every day | ORAL | Status: DC
  Administered 2011-12-24 – 2011-12-26 (×2): 2 via ORAL

## 2011-12-24 MED ORDER — SIMETHICONE 80 MG PO CHEW: 80.0000 mg | CHEWABLE_TABLET | ORAL | Status: DC | PRN

## 2011-12-24 MED ORDER — OXYTOCIN 10 UNIT/ML IJ SOLN
INTRAMUSCULAR | Status: AC
  Filled 2011-12-24: qty 4

## 2011-12-24 MED ORDER — IBUPROFEN 600 MG PO TABS
600.0000 mg | ORAL_TABLET | Freq: Four times a day (QID) | ORAL | Status: DC
  Administered 2011-12-25 – 2011-12-27 (×10): 600 mg via ORAL
  Filled 2011-12-24 (×10): qty 1

## 2011-12-24 MED ORDER — ONDANSETRON HCL 4 MG/2ML IJ SOLN
INTRAMUSCULAR | Status: DC | PRN
  Administered 2011-12-24: 4 mg via INTRAVENOUS

## 2011-12-24 MED ORDER — DEXTROSE 5 % IV SOLN
2.0000 g | Freq: Four times a day (QID) | INTRAVENOUS | Status: DC
  Administered 2011-12-25: 2 g via INTRAVENOUS
  Filled 2011-12-24 (×4): qty 2

## 2011-12-24 MED ORDER — MIDAZOLAM HCL 2 MG/2ML IJ SOLN: 0.5000 mg | Freq: Once | INTRAMUSCULAR | Status: DC | PRN

## 2011-12-24 MED ORDER — DIPHENHYDRAMINE HCL 25 MG PO CAPS
25.0000 mg | ORAL_CAPSULE | Freq: Four times a day (QID) | ORAL | Status: DC | PRN
  Filled 2011-12-24: qty 1

## 2011-12-24 MED ORDER — FENTANYL 2.5 MCG/ML BUPIVACAINE 1/10 % EPIDURAL INFUSION (WH - ANES)
INTRAMUSCULAR | Status: DC | PRN
  Administered 2011-12-24: 14 mL/h via EPIDURAL

## 2011-12-24 MED ORDER — METHYLERGONOVINE MALEATE 0.2 MG PO TABS: 0.2000 mg | ORAL_TABLET | ORAL | Status: DC | PRN

## 2011-12-24 MED ORDER — ONDANSETRON HCL 4 MG/2ML IJ SOLN: 4.0000 mg | INTRAMUSCULAR | Status: DC | PRN

## 2011-12-24 MED ORDER — NALBUPHINE HCL 10 MG/ML IJ SOLN
5.0000 mg | INTRAMUSCULAR | Status: DC | PRN
  Filled 2011-12-24 (×2): qty 1

## 2011-12-24 MED ORDER — SODIUM BICARBONATE 8.4 % IV SOLN
INTRAVENOUS | Status: DC | PRN
  Administered 2011-12-24: 5 mL via EPIDURAL

## 2011-12-24 MED ORDER — MEPERIDINE HCL 25 MG/ML IJ SOLN: 6.2500 mg | INTRAMUSCULAR | Status: DC | PRN

## 2011-12-24 MED ORDER — INFLUENZA VIRUS VACC SPLIT PF IM SUSP
0.5000 mL | INTRAMUSCULAR | Status: AC
  Administered 2011-12-25: 0.5 mL via INTRAMUSCULAR

## 2011-12-24 MED ORDER — KETOROLAC TROMETHAMINE 30 MG/ML IJ SOLN
30.0000 mg | Freq: Four times a day (QID) | INTRAMUSCULAR | Status: AC | PRN
  Filled 2011-12-24: qty 1

## 2011-12-24 MED ORDER — OXYTOCIN 40 UNITS IN LACTATED RINGERS INFUSION - SIMPLE MED: 62.5000 mL/h | INTRAVENOUS | Status: AC

## 2011-12-24 MED ORDER — SIMETHICONE 80 MG PO CHEW
80.0000 mg | CHEWABLE_TABLET | Freq: Three times a day (TID) | ORAL | Status: DC
  Administered 2011-12-24 – 2011-12-27 (×9): 80 mg via ORAL

## 2011-12-24 MED ORDER — TETANUS-DIPHTH-ACELL PERTUSSIS 5-2.5-18.5 LF-MCG/0.5 IM SUSP
0.5000 mL | Freq: Once | INTRAMUSCULAR | Status: AC
  Administered 2011-12-25: 0.5 mL via INTRAMUSCULAR

## 2011-12-24 MED ORDER — OXYCODONE-ACETAMINOPHEN 5-325 MG PO TABS
1.0000 | ORAL_TABLET | ORAL | Status: DC | PRN
  Administered 2011-12-25 – 2011-12-27 (×7): 1 via ORAL
  Filled 2011-12-24 (×7): qty 1

## 2011-12-24 MED ORDER — ACETAMINOPHEN 10 MG/ML IV SOLN
1000.0000 mg | Freq: Four times a day (QID) | INTRAVENOUS | Status: AC | PRN
  Filled 2011-12-24: qty 100

## 2011-12-24 MED ORDER — 0.9 % SODIUM CHLORIDE (POUR BTL) OPTIME
TOPICAL | Status: DC | PRN
  Administered 2011-12-24: 1000 mL

## 2011-12-24 MED ORDER — METOCLOPRAMIDE HCL 5 MG/ML IJ SOLN: 10.0000 mg | Freq: Three times a day (TID) | INTRAMUSCULAR | Status: DC | PRN

## 2011-12-24 MED ORDER — PHENYLEPHRINE HCL 10 MG/ML IJ SOLN
INTRAMUSCULAR | Status: DC | PRN
  Administered 2011-12-24: 80 ug via INTRAVENOUS
  Administered 2011-12-24: 40 ug via INTRAVENOUS
  Administered 2011-12-24: 80 ug via INTRAVENOUS

## 2011-12-24 MED ORDER — SODIUM CHLORIDE 0.9 % IJ SOLN: 3.0000 mL | INTRAMUSCULAR | Status: DC | PRN

## 2011-12-24 MED ORDER — ZOLPIDEM TARTRATE 5 MG PO TABS: 5.0000 mg | ORAL_TABLET | Freq: Every evening | ORAL | Status: DC | PRN

## 2011-12-24 MED ORDER — MENTHOL 3 MG MT LOZG: 1.0000 | LOZENGE | OROMUCOSAL | Status: DC | PRN

## 2011-12-24 MED ORDER — EPHEDRINE SULFATE 50 MG/ML IJ SOLN
INTRAMUSCULAR | Status: DC | PRN
  Administered 2011-12-24 (×3): 10 mg via INTRAVENOUS

## 2011-12-24 MED ORDER — METHYLERGONOVINE MALEATE 0.2 MG/ML IJ SOLN
INTRAMUSCULAR | Status: DC | PRN
  Administered 2011-12-24: 0.2 mg via INTRAMUSCULAR

## 2011-12-24 MED ORDER — LANOLIN HYDROUS EX OINT: 1.0000 "application " | TOPICAL_OINTMENT | CUTANEOUS | Status: DC | PRN

## 2011-12-24 MED ORDER — MORPHINE SULFATE (PF) 0.5 MG/ML IJ SOLN
INTRAMUSCULAR | Status: DC | PRN
  Administered 2011-12-24: 4 mg via EPIDURAL
  Administered 2011-12-24: 1 mg via INTRAVENOUS

## 2011-12-24 MED ORDER — PROMETHAZINE HCL 25 MG/ML IJ SOLN: 6.2500 mg | INTRAMUSCULAR | Status: DC | PRN

## 2011-12-24 MED ORDER — FENTANYL CITRATE 0.05 MG/ML IJ SOLN: 25.0000 ug | INTRAMUSCULAR | Status: DC | PRN

## 2011-12-24 MED ORDER — METHYLERGONOVINE MALEATE 0.2 MG/ML IJ SOLN: 0.2000 mg | INTRAMUSCULAR | Status: DC | PRN

## 2011-12-24 MED ORDER — SCOPOLAMINE 1 MG/3DAYS TD PT72: 1.0000 | MEDICATED_PATCH | Freq: Once | TRANSDERMAL | Status: DC

## 2011-12-24 MED ORDER — NALOXONE HCL 0.4 MG/ML IJ SOLN: 0.4000 mg | INTRAMUSCULAR | Status: DC | PRN

## 2011-12-24 MED ORDER — KETOROLAC TROMETHAMINE 30 MG/ML IJ SOLN
30.0000 mg | Freq: Four times a day (QID) | INTRAMUSCULAR | Status: AC | PRN
  Administered 2011-12-24: 30 mg via INTRAVENOUS

## 2011-12-24 MED ORDER — SODIUM BICARBONATE 8.4 % IV SOLN
INTRAVENOUS | Status: AC
  Filled 2011-12-24: qty 50

## 2011-12-24 MED ORDER — NALBUPHINE SYRINGE 5 MG/0.5 ML
INJECTION | INTRAMUSCULAR | Status: AC
  Administered 2011-12-24: 10 mg via INTRAMUSCULAR
  Filled 2011-12-24: qty 1

## 2011-12-24 SURGICAL SUPPLY — 41 items
CANISTER WOUND CARE 500ML ATS (WOUND CARE) IMPLANT
CLOTH BEACON ORANGE TIMEOUT ST (SAFETY) ×2 IMPLANT
CONTAINER PREFILL 10% NBF 15ML (MISCELLANEOUS) ×4 IMPLANT
DERMABOND ADVANCED (GAUZE/BANDAGES/DRESSINGS) ×1
DERMABOND ADVANCED .7 DNX12 (GAUZE/BANDAGES/DRESSINGS) ×1 IMPLANT
DRAPE SURG 17X23 STRL (DRAPES) ×2 IMPLANT
DRSG COVADERM 4X10 (GAUZE/BANDAGES/DRESSINGS) ×2 IMPLANT
DRSG VAC ATS LRG SENSATRAC (GAUZE/BANDAGES/DRESSINGS) IMPLANT
DRSG VAC ATS MED SENSATRAC (GAUZE/BANDAGES/DRESSINGS) IMPLANT
DRSG VAC ATS SM SENSATRAC (GAUZE/BANDAGES/DRESSINGS) IMPLANT
DURAPREP 26ML APPLICATOR (WOUND CARE) ×2 IMPLANT
ELECT REM PT RETURN 9FT ADLT (ELECTROSURGICAL) ×2
ELECTRODE REM PT RTRN 9FT ADLT (ELECTROSURGICAL) ×1 IMPLANT
EXTRACTOR VACUUM M CUP 4 TUBE (SUCTIONS) IMPLANT
GLOVE BIO SURGEON STRL SZ8 (GLOVE) ×4 IMPLANT
GOWN PREVENTION PLUS LG XLONG (DISPOSABLE) ×4 IMPLANT
GOWN PREVENTION PLUS XLARGE (GOWN DISPOSABLE) ×2 IMPLANT
KIT ABG SYR 3ML LUER SLIP (SYRINGE) IMPLANT
NEEDLE HYPO 25X5/8 SAFETYGLIDE (NEEDLE) ×2 IMPLANT
NS IRRIG 1000ML POUR BTL (IV SOLUTION) ×2 IMPLANT
PACK C SECTION WH (CUSTOM PROCEDURE TRAY) ×2 IMPLANT
PAD OB MATERNITY 4.3X12.25 (PERSONAL CARE ITEMS) ×2 IMPLANT
RTRCTR C-SECT PINK 25CM LRG (MISCELLANEOUS) ×2 IMPLANT
SLEEVE SCD COMPRESS KNEE MED (MISCELLANEOUS) ×2 IMPLANT
STAPLER VISISTAT 35W (STAPLE) ×2 IMPLANT
SUT GUT PLAIN 0 CT-3 TAN 27 (SUTURE) IMPLANT
SUT MNCRL 0 VIOLET CTX 36 (SUTURE) ×3 IMPLANT
SUT MNCRL AB 4-0 PS2 18 (SUTURE) IMPLANT
SUT MON AB 2-0 CT1 27 (SUTURE) ×2 IMPLANT
SUT MON AB 3-0 SH 27 (SUTURE)
SUT MON AB 3-0 SH27 (SUTURE) IMPLANT
SUT MONOCRYL 0 CTX 36 (SUTURE) ×3
SUT PDS AB 0 CTX 60 (SUTURE) IMPLANT
SUT PLAIN 2 0 XLH (SUTURE) IMPLANT
SUT VIC AB 0 CTX 36 (SUTURE)
SUT VIC AB 0 CTX36XBRD ANBCTRL (SUTURE) IMPLANT
SUT VIC AB 2-0 CT1 27 (SUTURE)
SUT VIC AB 2-0 CT1 TAPERPNT 27 (SUTURE) IMPLANT
TOWEL OR 17X24 6PK STRL BLUE (TOWEL DISPOSABLE) ×4 IMPLANT
TRAY FOLEY CATH 14FR (SET/KITS/TRAYS/PACK) IMPLANT
WATER STERILE IRR 1000ML POUR (IV SOLUTION) ×2 IMPLANT

## 2011-12-24 NOTE — Anesthesia Postprocedure Evaluation (Signed)
Anesthesia Post Note  Patient: Courtney Johns  Procedure(s) Performed: Procedure(s) (LRB): CESAREAN SECTION (N/A)  Anesthesia type: Epidural  Patient location: PACU  Post pain: Pain level controlled  Post assessment: Post-op Vital signs reviewed  Last Vitals:  Filed Vitals:   12/24/11 0955  BP: 112/54  Pulse: 102  Temp: 36.9 C  Resp: 27    Post vital signs: Reviewed  Level of consciousness: awake  Complications: No apparent anesthesia complications

## 2011-12-24 NOTE — Addendum Note (Signed)
Addendum  created 12/24/11 1037 by Sandrea Hughs., MD   Modules edited:Anesthesia Attestations, Anesthesia Flowsheet, Anesthesia Responsible Staff

## 2011-12-24 NOTE — Anesthesia Postprocedure Evaluation (Signed)
  Anesthesia Post-op Note  Patient: Courtney Johns  Procedure(s) Performed: Procedure(s) (LRB) with comments: CESAREAN SECTION (N/A)  Patient Location: Mother/Baby  Anesthesia Type: Epidural  Level of Consciousness: awake  Airway and Oxygen Therapy: Patient Spontanous Breathing  Post-op Pain: mild  Post-op Assessment: Patient's Cardiovascular Status Stable and Respiratory Function Stable  Post-op Vital Signs: stable  Complications: No apparent anesthesia complications

## 2011-12-24 NOTE — Progress Notes (Addendum)
Dr Clearance Coots called for update. Notified SVE unchanged, 20 mu Pit now after being off x 2 hours last evening, ROM x 48 hours but remains afebrile, comfortable with epidural. Will be over shortly to discuss possible c section with pt.

## 2011-12-24 NOTE — Transfer of Care (Signed)
Immediate Anesthesia Transfer of Care Note  Patient: Courtney Johns  Procedure(s) Performed: Procedure(s) (LRB) with comments: CESAREAN SECTION (N/A)  Patient Location: PACU  Anesthesia Type: Epidural  Level of Consciousness: awake, alert  and oriented  Airway & Oxygen Therapy: Patient Spontanous Breathing  Post-op Assessment: Report given to PACU RN and Post -op Vital signs reviewed and stable  Post vital signs: Reviewed and stable  Complications: No apparent anesthesia complications

## 2011-12-24 NOTE — Progress Notes (Signed)
Courtney Johns is a 31 y.o. G1P0000 at [redacted]w[redacted]d by LMP admitted for rupture of membranes  Subjective:   Objective: BP 104/73  Pulse 90  Temp 97.6 F (36.4 C) (Oral)  Resp 16  Ht 5\' 6"  (1.676 m)  Wt 78.019 kg (172 lb)  BMI 27.76 kg/m2  SpO2 100%  LMP 03/12/2011 I/O last 3 completed shifts: In: -  Out: 550 [Urine:550]    FHT:  FHR: 150 bpm, variability: moderate,  accelerations:  Present,  decelerations:  Absent UC:   regular, every 3-5 minutes SVE:   Dilation: 3.5 Effacement (%): 70 Station: -2 Exam by:: Penley, RN   Labs: Lab Results  Component Value Date   WBC 9.3 12/22/2011   HGB 13.6 12/22/2011   HCT 40.1 12/22/2011   MCV 93.0 12/22/2011   PLT 292 12/22/2011    Assessment / Plan: Protracted latent phase  Labor: No progress for greater than 24 hours.  Now has been ruptured for greater ~ 48 hours.  Will proceed with C/S for protracted latent phase. Preeclampsia:  n/a Fetal Wellbeing:  Category I Pain Control:  Epidural I/D:  n/a Anticipated MOD:  C/S for protracted latent phase.  HARPER,CHARLES A 12/24/2011, 8:33 AM

## 2011-12-24 NOTE — Addendum Note (Signed)
Addendum  created 12/24/11 1758 by Renford Dills, CRNA   Modules edited:Notes Section

## 2011-12-24 NOTE — Addendum Note (Signed)
Addendum  created 12/24/11 1035 by Velna Hatchet, MD   Modules edited:Orders, PRL Based Order Sets

## 2011-12-24 NOTE — Anesthesia Procedure Notes (Signed)

## 2011-12-24 NOTE — Addendum Note (Signed)
Addendum  created 12/24/11 1037 by John Kynsie Falkner Jr., MD   Modules edited:Anesthesia Attestations, Anesthesia Flowsheet, Anesthesia Responsible Staff    

## 2011-12-25 ENCOUNTER — Encounter (HOSPITAL_COMMUNITY): Payer: Self-pay | Admitting: Obstetrics

## 2011-12-25 LAB — CBC
Platelets: 189 10*3/uL (ref 150–400)
RBC: 2.8 MIL/uL — ABNORMAL LOW (ref 3.87–5.11)
RDW: 13.5 % (ref 11.5–15.5)
WBC: 10.4 10*3/uL (ref 4.0–10.5)

## 2011-12-25 NOTE — Progress Notes (Signed)
UR chart review completed.  

## 2011-12-25 NOTE — Procedures (Signed)
Cesarean Section Procedure Note   Courtney Johns   12/22/2011 - 12/24/2011  Indications: Protracted latent phase.   Pre-operative Diagnosis: arrest of latent phase.   Post-operative Diagnosis: Same   Surgeon: HARPER,CHARLES A  Assistants: Surgical technician.  Anesthesia: epidural  Procedure Details:  The patient was seen in the Holding Room. The risks, benefits, complications, treatment options, and expected outcomes were discussed with the patient. The patient concurred with the proposed plan, giving informed consent. The patient was identified as Engineer, site and the procedure verified as C-Section Delivery. A Time Out was held and the above information confirmed.  After induction of anesthesia, the patient was draped and prepped in the usual sterile manner. A transverse incision was made and carried down through the subcutaneous tissue to the fascia. The fascial incision was made and extended transversely. The fascia was separated from the underlying rectus tissue superiorly and inferiorly. The peritoneum was identified and entered. The peritoneal incision was extended longitudinally. The utero-vesical peritoneal reflection was incised transversely and the bladder flap was bluntly freed from the lower uterine segment. A low transverse uterine incision was made. Delivered from cephalic presentation was a 2765 gram living newborn female infant(s). APGAR (1 MIN): 9 APGAR (5 MINS): 9  APGAR (10 MINS):   A cord ph was not sent. The umbilical cord was clamped and cut cord. A sample was obtained for evaluation. The placenta was removed Intact and appeared normal.  The uterine incision was closed with running locked sutures of 0 Monocryl. A second imbricating layer of the same suture was placed.  Hemostasis was observed. The paracolic gutters were irrigated. The parieto peritoneum was closed in a running fashion with 2-0 Vicryl.  The fascia was then reapproximated with running sutures of 0 Vicryl.   The skin was closed with staples.  Instrument, sponge, and needle counts were correct prior the abdominal closure and were correct at the conclusion of the case.    Findings:  Normal uterus, ovaries and tubes.   Estimated Blood Loss:  Total IV Fluids:   Urine Output: 150CC OF clear urine  Specimens: Placenta  Complications: no complications  Disposition: PACU - hemodynamically stable.  Maternal Condition: stable   Baby condition / location:  nursery-stable    Signed: Surgeon(s): Brock Bad, MD

## 2011-12-25 NOTE — Progress Notes (Signed)
Subjective: Postpartum Day 1: Cesarean Delivery Patient reports no complaints.    Objective: Vital signs in last 24 hours: Temp:  [97.5 F (36.4 C)-99.2 F (37.3 C)] 97.5 F (36.4 C) (10/11 0530) Pulse Rate:  [87-109] 87  (10/11 0530) Resp:  [18-20] 20  (10/11 0530) BP: (115-135)/(70-95) 125/79 mmHg (10/11 0530) SpO2:  [98 %-100 %] 100 % (10/11 0530) Weight:  [78.019 kg (172 lb)] 78.019 kg (172 lb) (10/10 1420)  Physical Exam:  General: alert and no distress Lochia: appropriate Uterine Fundus: firm Incision: healing well DVT Evaluation: No evidence of DVT seen on physical exam.   Basename 12/25/11 0540 12/22/11 1330  HGB 9.0* 13.6  HCT 26.6* 40.1    Assessment/Plan: Status post Cesarean section. Doing well postoperatively.  Continue current care.  Eldo Umanzor A 12/25/2011, 12:07 PM

## 2011-12-26 NOTE — Progress Notes (Signed)
Patient ID: Courtney Johns, female   DOB: [birth year], 31 y.o.   MRN: [id number] Postop day 2 Vital signs normal Incision clean and dry Lochia moderate Legs negative Doing well

## 2011-12-27 NOTE — Discharge Summary (Signed)
Obstetric Discharge Summary Reason for Admission: onset of labor Prenatal Procedures: none Intrapartum Procedures: cesarean: low cervical, transverse Postpartum Procedures: none Complications-Operative and Postpartum: none Hemoglobin  Date Value Range Status  12/25/2011 9.0* 12.0 - 15.0 g/dL Final     HCT  Date Value Range Status  12/25/2011 26.6* 36.0 - 46.0 % Final    Physical Exam:  General: alert Lochia: appropriate Uterine Fundus: firm Incision: healing well DVT Evaluation: No evidence of DVT seen on physical exam.  Discharge Diagnoses: Term Pregnancy-delivered  Discharge Information: Date: 12/27/2011 Activity: pelvic rest Diet: routine Medications: Percocet Condition: stable Instructions: refer to practice specific booklet Discharge to: home Follow-up Information    Call in 6 weeks to follow up.         Newborn Data: Live born female  Birth Weight: 6 lb 1.5 oz (2765 g) APGAR: 9, 9  Home with mother.  MARSHALL,BERNARD A 12/27/2011, 6:49 AM

## 2012-02-03 ENCOUNTER — Other Ambulatory Visit: Payer: Self-pay | Admitting: Internal Medicine

## 2012-02-03 ENCOUNTER — Ambulatory Visit
Admission: RE | Admit: 2012-02-03 | Discharge: 2012-02-03 | Disposition: A | Discharge status: Home / Self Care | Payer: Medicaid Other | Source: Ambulatory Visit | Attending: Internal Medicine | Admitting: Internal Medicine

## 2012-02-03 DIAGNOSIS — R609 Edema, unspecified: Secondary | ICD-10-CM

## 2012-02-03 DIAGNOSIS — R52 Pain, unspecified: Secondary | ICD-10-CM

## 2012-10-19 IMAGING — US US OB FOLLOW-UP
2 series · 12 of 28 positions shown · non-contrast
Comparison: none

[Series 1: us ob follow up · 11 of 53 slices shown (1 of 2)]
[im 3/53]
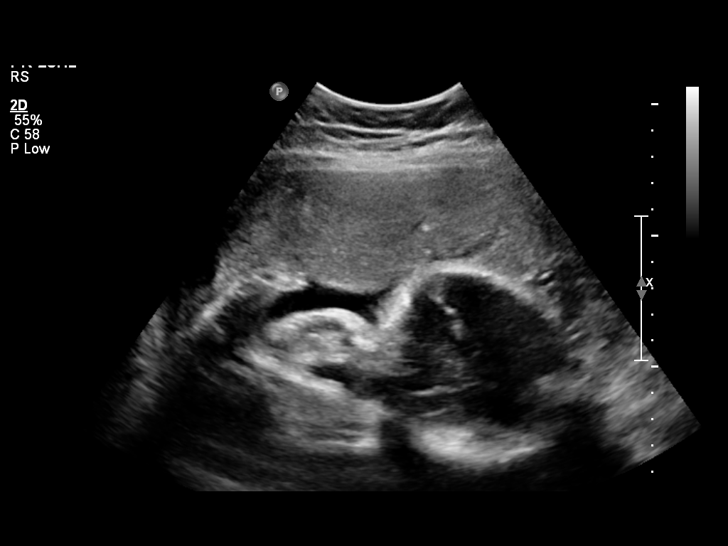
[im 7/53]
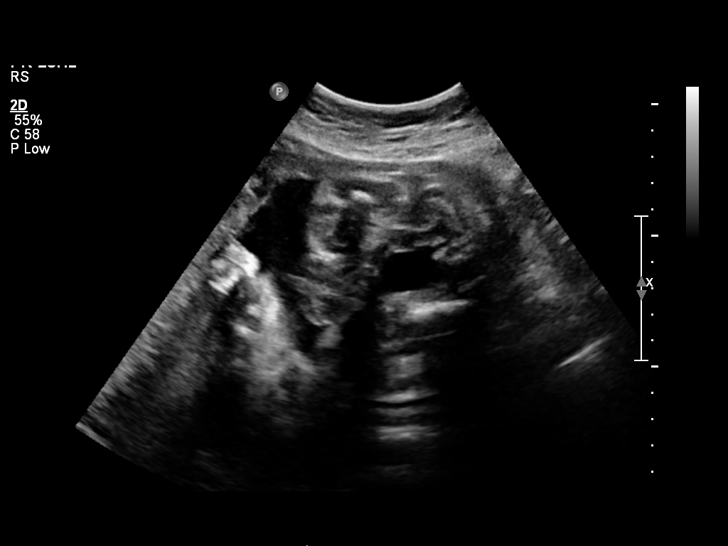
[im 11/53]
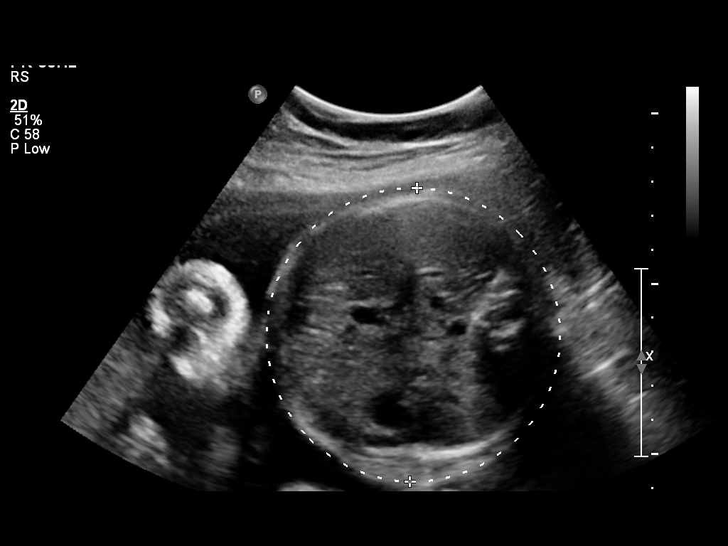
[im 17/53]
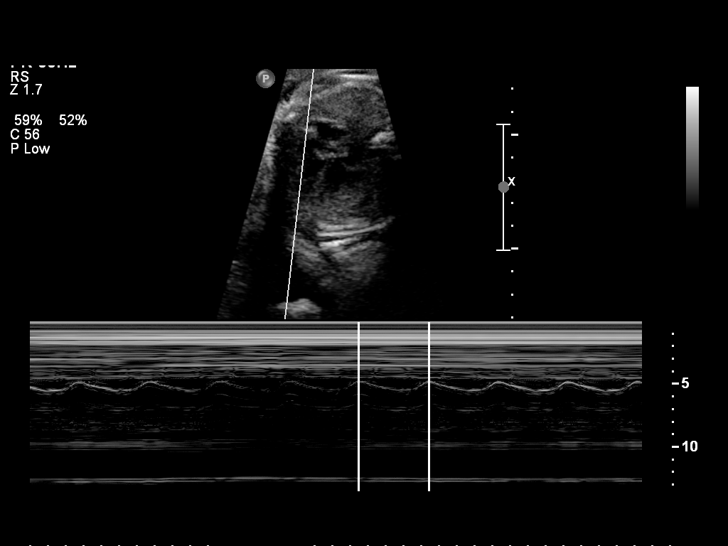
[im 21/53]
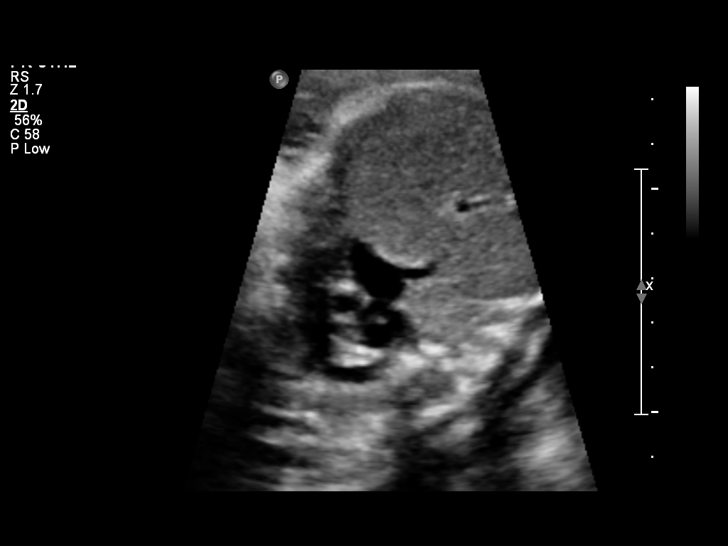
[im 25/53]
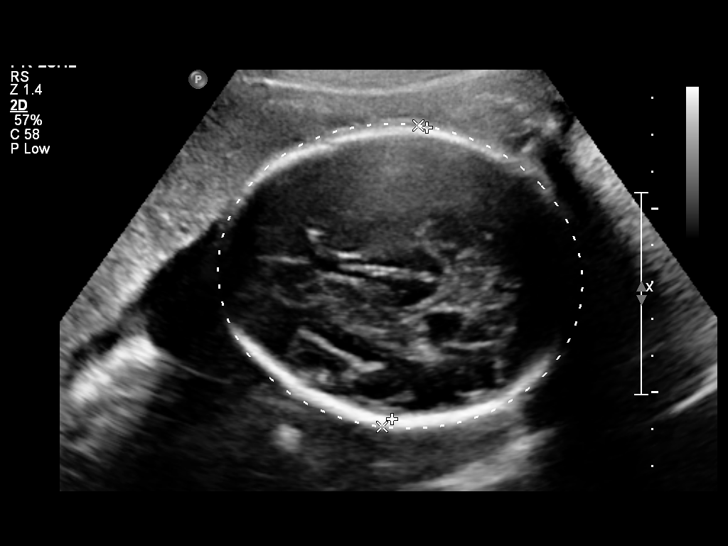
[im 32/53]
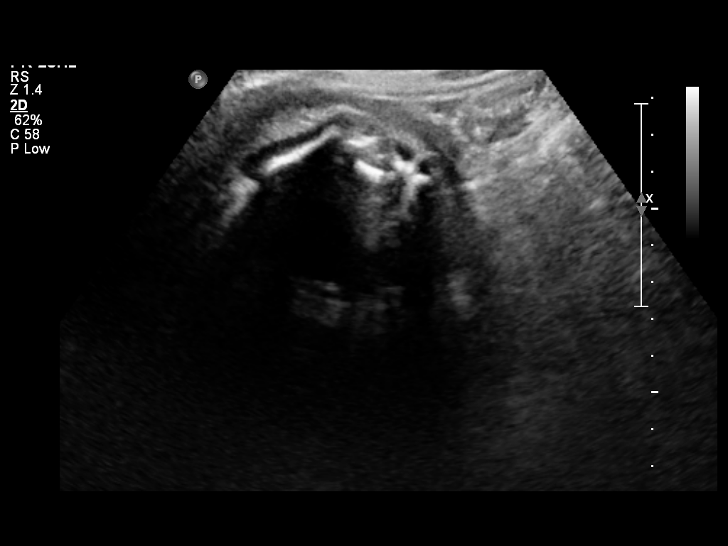
[im 36/53]
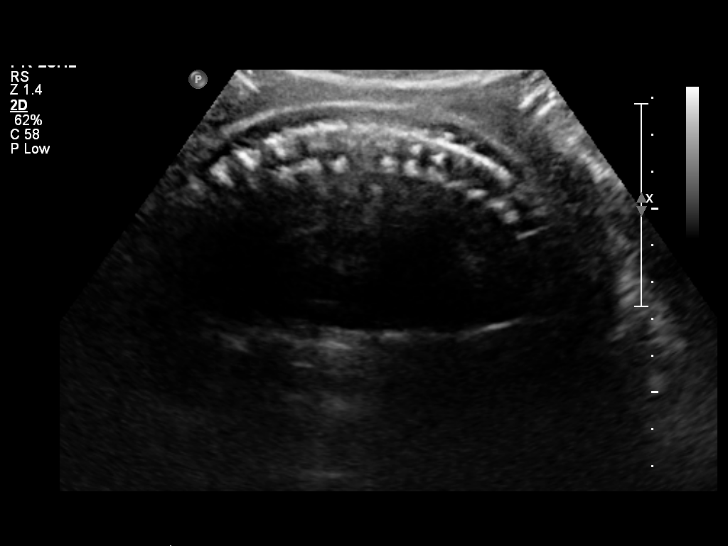
[im 40/53]
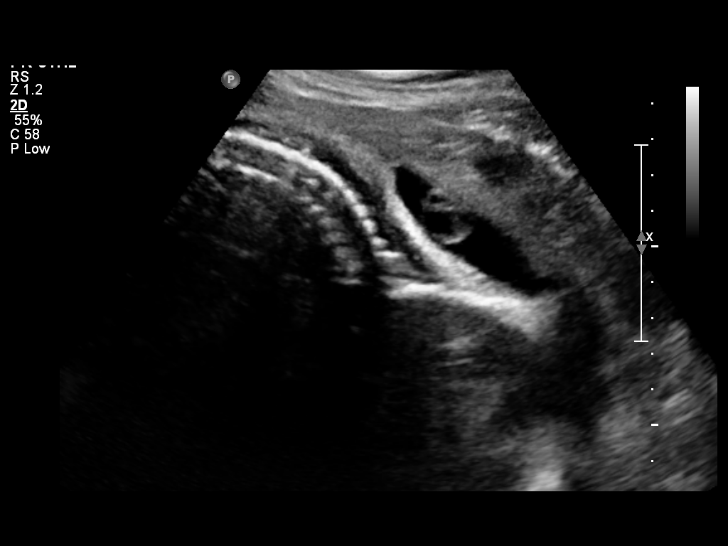
[im 46/53]
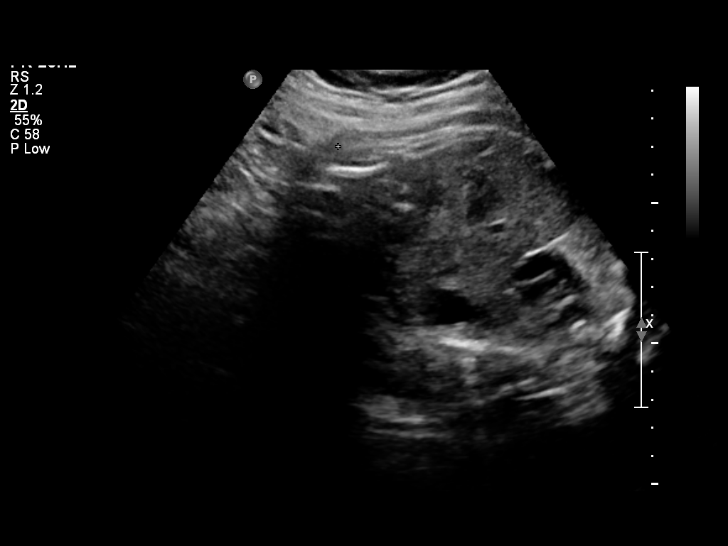
[im 50/53]
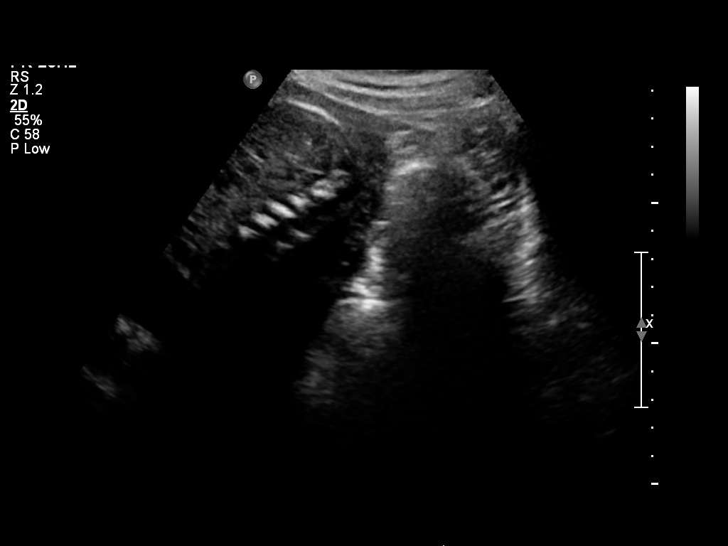

[Series 1: us ob follow up · 1 of 3 slices shown (2 of 2)]
[im 1/3]
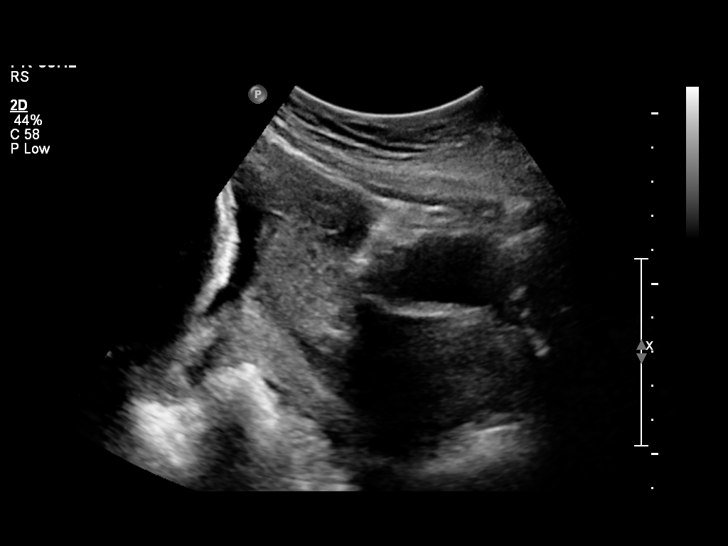

[12 of 28 positions shown; findings below may reference images not displayed]

OBSTETRICS REPORT
                      (Signed Final 10/16/2011 [DATE])

 Order#:         [id number]
Procedures

 US OB FOLLOW UP                                       76816.1
Indications

 Follow-up incomplete fetal anatomic evaluation
 Assess Fetal Growth / Estimated Fetal Weight
Fetal Evaluation

 Fetal Heart Rate:  152                         bpm
 Cardiac Activity:  Observed
 Presentation:      Cephalic
 Placenta:          Anterior, above cervical os

 Amniotic Fluid
 AFI FV:      Subjectively within normal limits
 AFI Sum:     11.61   cm      28   %Tile     Larg Pckt:     5.7  cm
 RUQ:   5.7    cm    RLQ:    0.03   cm    LUQ:   1.24    cm   LLQ:    4.64   cm
Biometry

 BPD:     79.6  mm    G. Age:   32w 0d                CI:        77.61   70 - 86
                                                      FL/HC:      21.2   19.3 -

 HC:       286  mm    G. Age:   31w 3d       22  %    HC/AC:      1.06   0.96 -

 AC:     269.6  mm    G. Age:   31w 0d       45  %    FL/BPD:     76.3   71 - 87
 FL:      60.7  mm    G. Age:   31w 4d       48  %    FL/AC:      22.5   20 - 24

 Est. FW:    9526  gm    3 lb 13 oz      59  %
Gestational Age

 LMP:           31w 1d       Date:   03/12/11                 EDD:   12/17/11
 U/S Today:     31w 4d                                        EDD:   12/14/11
 Best:          31w 1d    Det. By:   LMP  (03/12/11)          EDD:   12/17/11
Anatomy

 Cranium:           Appears normal      Aortic Arch:       Previously seen
 Fetal Cavum:       Appears normal      Ductal Arch:       Previously seen
 Ventricles:        Appears normal      Diaphragm:         Appears normal
 Choroid Plexus:    Previously seen     Stomach:           Appears
                                                           normal, left
                                                           sided
 Cerebellum:        Previously seen     Abdomen:           Appears normal
 Posterior Fossa:   Previously seen     Abdominal Wall:    Previously seen
 Nuchal Fold:       Previously seen     Cord Vessels:      Previously seen
 Face:              Previously seen     Kidneys:           Appear normal
 Heart:             Appears normal      Bladder:           Appears normal
                    (4 chamber &
                    axis)
 RVOT:              Appears normal      Spine:             Appears normal
 LVOT:              Appears normal      Limbs:             Previously seen

 Other:     Fetus appears to be a male. Heels and 5th digit
            previously seen.
Cervix Uterus Adnexa

 Cervical Length:   4.5       cm

 Cervix:       Closed.
 Left Ovary:   Previously seen.
 Right Ovary:  Normal, measuring
 Adnexa:     No abnormality visualized.
Myomas

 Site                     L(cm)      W(cm)      D(cm)       Location
 Anterior
 Blood Flow                  RI       PI       Comments
                                               Previously seen
Impression

 Single intrauterine gestation demonstrating an estimated
 gestational age by ultrasound of 31w 4d. This is correlated
 with expected estimated gestational age by LMP of 31w 1d.
 EFW is currently at the 59%.

 An improved anatomic assessment of the fetal heart and
 spine was possible today. No late developing fetal anatomic
 abnormalities are noted associated with the lateral ventricles,
 four chamber heart, stomach, kidneys or bladder.

 Subjectively and quantitatively normal amniotic fluid volume.

 Focal fibroid as noted above.

 Normal cervical length and appearance.

 questions or concerns.

## 2012-12-08 LAB — OB RESULTS CONSOLE GC/CHLAMYDIA: GC PROBE AMP, GENITAL: NEGATIVE

## 2012-12-13 ENCOUNTER — Ambulatory Visit (INDEPENDENT_AMBULATORY_CARE_PROVIDER_SITE_OTHER): Payer: Medicaid Other | Admitting: Obstetrics

## 2012-12-13 ENCOUNTER — Encounter: Payer: Self-pay | Admitting: Obstetrics

## 2012-12-13 VITALS — BP 112/74 | Temp 98.2°F | Wt 165.0 lb

## 2012-12-13 DIAGNOSIS — Z369 Encounter for antenatal screening, unspecified: Secondary | ICD-10-CM

## 2012-12-13 DIAGNOSIS — Z3201 Encounter for pregnancy test, result positive: Secondary | ICD-10-CM

## 2012-12-13 DIAGNOSIS — Z3482 Encounter for supervision of other normal pregnancy, second trimester: Secondary | ICD-10-CM

## 2012-12-13 DIAGNOSIS — Z3481 Encounter for supervision of other normal pregnancy, first trimester: Secondary | ICD-10-CM

## 2012-12-13 DIAGNOSIS — Z113 Encounter for screening for infections with a predominantly sexual mode of transmission: Secondary | ICD-10-CM

## 2012-12-13 LAB — OB RESULTS CONSOLE GC/CHLAMYDIA: Chlamydia: NEGATIVE

## 2012-12-13 LAB — POCT URINALYSIS DIPSTICK
Bilirubin, UA: NEGATIVE
Blood, UA: NEGATIVE
Glucose, UA: NEGATIVE
Nitrite, UA: NEGATIVE
Urobilinogen, UA: NEGATIVE

## 2012-12-13 NOTE — Progress Notes (Signed)
P 94  Subjective:    Courtney Johns is being seen today for her first obstetrical visit.  This is not a planned pregnancy. She is at [redacted]w[redacted]d gestation. Her obstetrical history is significant for C section. Relationship with FOB: FOB in [place]. Patient does intend to breast feed. Pregnancy history fully reviewed.  Menstrual History: OB History   Grav Para Term Preterm Abortions TAB SAB Ect Mult Living   2 1 1  0 0 0 0 0 0 1      Menarche age: 62  Patient's last menstrual period was 07/30/2012.    The following portions of the patient's history were reviewed and updated as appropriate: allergies, current medications, past family history, past medical history, past social history, past surgical history and problem list.  Review of Systems Pertinent items are noted in HPI.    Objective:    General appearance: alert and no distress Abdomen: normal findings: soft, non-tender Pelvic: cervix normal in appearance, external genitalia normal, no adnexal masses or tenderness, no cervical motion tenderness and vagina normal without discharge .  Uterus 20 weeks size, soft, NT. Extremities: extremities normal, atraumatic, no cyanosis or edema    Assessment:    Pregnancy at [redacted]w[redacted]d weeks    Plan:    Initial labs drawn. Prenatal vitamins.  Counseling provided regarding continued use of seat belts, cessation of alcohol consumption, smoking or use of illicit drugs; infection precautions i.e., influenza/TDAP immunizations, toxoplasmosis,CMV, parvovirus, listeria and varicella; workplace safety, exercise during pregnancy; routine dental care, safe medications, sexual activity, hot tubs, saunas, pools, travel, caffeine use, fish and methlymercury, potential toxins, hair treatments, varicose veins Weight gain recommendations reviewed: underweight/BMI< 18.5--> gain 28 - 40 lbs; normal weight/BMI 18.5 - 24.9--> gain 25 - 35 lbs; overweight/BMI 25 - 29.9--> gain 15 - 25 lbs; obese/BMI >30->gain  11 - 20  lbs Problem list reviewed and updated. AFP3 discussed: requested. Role of ultrasound in pregnancy discussed; fetal survey: requested. Amniocentesis discussed: requested. Follow up in 4 weeks. 50% of 20 min visit spent on counseling and coordination of care.

## 2012-12-14 LAB — VARICELLA ZOSTER ANTIBODY, IGG: Varicella IgG: 12.3 Index (ref <135.00)

## 2012-12-14 LAB — WET PREP BY MOLECULAR PROBE: Gardnerella vaginalis: NEGATIVE

## 2012-12-14 LAB — OBSTETRIC PANEL
Basophils Relative: 0 % (ref 0–1)
Eosinophils Absolute: 0.2 10*3/uL (ref 0.0–0.7)
Eosinophils Relative: 3 % (ref 0–5)
Hepatitis B Surface Ag: NEGATIVE
MCH: 32 pg (ref 26.0–34.0)
MCHC: 33.7 g/dL (ref 30.0–36.0)
MCV: 95 fL (ref 78.0–100.0)
Neutrophils Relative %: 71 % (ref 43–77)
Platelets: 288 10*3/uL (ref 150–400)
RDW: 14.7 % (ref 11.5–15.5)
Rh Type: POSITIVE

## 2012-12-14 LAB — CULTURE, OB URINE: Organism ID, Bacteria: NO GROWTH

## 2012-12-14 LAB — PAP IG W/ RFLX HPV ASCU

## 2012-12-14 LAB — GC/CHLAMYDIA PROBE AMP: GC Probe RNA: NEGATIVE

## 2012-12-14 LAB — HIV ANTIBODY (ROUTINE TESTING W REFLEX): HIV: NONREACTIVE

## 2012-12-15 LAB — HEMOGLOBINOPATHY EVALUATION
Hemoglobin Other: 0 %
Hgb A2 Quant: 3.2 % (ref 2.2–3.2)
Hgb A: 96.8 % (ref 96.8–97.8)
Hgb S Quant: 0 %

## 2012-12-20 ENCOUNTER — Ambulatory Visit (HOSPITAL_COMMUNITY): Admission: RE | Admit: 2012-12-20 | Payer: Medicaid Other | Source: Ambulatory Visit

## 2012-12-20 ENCOUNTER — Ambulatory Visit (HOSPITAL_COMMUNITY)
Admission: RE | Admit: 2012-12-20 | Discharge: 2012-12-20 | Disposition: A | Discharge status: Home / Self Care | Payer: Medicaid Other | Source: Ambulatory Visit | Attending: Obstetrics | Admitting: Obstetrics

## 2012-12-20 DIAGNOSIS — O34219 Maternal care for unspecified type scar from previous cesarean delivery: Secondary | ICD-10-CM | POA: Insufficient documentation

## 2012-12-20 DIAGNOSIS — Z369 Encounter for antenatal screening, unspecified: Secondary | ICD-10-CM

## 2012-12-20 DIAGNOSIS — Z3689 Encounter for other specified antenatal screening: Secondary | ICD-10-CM | POA: Insufficient documentation

## 2012-12-24 ENCOUNTER — Telehealth: Payer: Self-pay | Admitting: *Deleted

## 2012-12-24 MED ORDER — FLUCONAZOLE 150 MG PO TABS: 150.0000 mg | ORAL_TABLET | Freq: Once | ORAL | Status: DC

## 2012-12-24 NOTE — Telephone Encounter (Signed)
Call placed to patient she was informed per Dr Clearance Coots instructions.  Patient advised if no improvement to follow up at office visit. Patient verbalized understanding and agrees as instructed.

## 2012-12-24 NOTE — Telephone Encounter (Signed)
Message copied by Glendell Docker on Sat Dec 24, 2012 12:04 PM ------      Message from: Coral Ceo A      Created: Thu Dec 15, 2012  5:55 AM       Diflucan 150 mg po. ------

## 2013-01-12 ENCOUNTER — Encounter: Payer: Self-pay | Admitting: Obstetrics

## 2013-01-12 ENCOUNTER — Ambulatory Visit (INDEPENDENT_AMBULATORY_CARE_PROVIDER_SITE_OTHER): Payer: Medicaid Other | Admitting: Obstetrics

## 2013-01-12 VITALS — BP 101/69 | Wt 168.0 lb

## 2013-01-12 DIAGNOSIS — Z3482 Encounter for supervision of other normal pregnancy, second trimester: Secondary | ICD-10-CM

## 2013-01-12 DIAGNOSIS — Z3480 Encounter for supervision of other normal pregnancy, unspecified trimester: Secondary | ICD-10-CM

## 2013-01-12 LAB — POCT URINALYSIS DIPSTICK
Bilirubin, UA: NEGATIVE
Glucose, UA: NEGATIVE
Ketones, UA: NEGATIVE
Nitrite, UA: NEGATIVE

## 2013-01-12 NOTE — Progress Notes (Signed)
HR - 90 Patient in office for routine OB visit, denies any concerns at this time.

## 2013-01-26 ENCOUNTER — Ambulatory Visit (INDEPENDENT_AMBULATORY_CARE_PROVIDER_SITE_OTHER): Payer: Medicaid Other | Admitting: Obstetrics

## 2013-01-26 VITALS — BP 106/71 | Temp 98.8°F | Wt 168.0 lb

## 2013-01-26 DIAGNOSIS — Z3480 Encounter for supervision of other normal pregnancy, unspecified trimester: Secondary | ICD-10-CM

## 2013-01-26 DIAGNOSIS — Z3482 Encounter for supervision of other normal pregnancy, second trimester: Secondary | ICD-10-CM

## 2013-01-26 LAB — POCT URINALYSIS DIPSTICK
Bilirubin, UA: NEGATIVE
Blood, UA: NEGATIVE
Glucose, UA: NEGATIVE
Leukocytes, UA: NEGATIVE
Nitrite, UA: NEGATIVE
Urobilinogen, UA: NEGATIVE

## 2013-01-26 NOTE — Progress Notes (Signed)
HR - 82 Pt in office for routine OB visit, denies any concerns at this time

## 2013-01-27 ENCOUNTER — Encounter: Payer: Self-pay | Admitting: Obstetrics

## 2013-02-15 ENCOUNTER — Ambulatory Visit (INDEPENDENT_AMBULATORY_CARE_PROVIDER_SITE_OTHER): Payer: Medicaid Other | Admitting: Obstetrics

## 2013-02-15 ENCOUNTER — Other Ambulatory Visit: Payer: Medicaid Other

## 2013-02-15 ENCOUNTER — Encounter: Payer: Self-pay | Admitting: Obstetrics

## 2013-02-15 VITALS — BP 111/71 | Temp 97.1°F | Wt 166.0 lb

## 2013-02-15 DIAGNOSIS — Z3482 Encounter for supervision of other normal pregnancy, second trimester: Secondary | ICD-10-CM

## 2013-02-15 DIAGNOSIS — Z3480 Encounter for supervision of other normal pregnancy, unspecified trimester: Secondary | ICD-10-CM

## 2013-02-15 DIAGNOSIS — Z3483 Encounter for supervision of other normal pregnancy, third trimester: Secondary | ICD-10-CM

## 2013-02-15 LAB — POCT URINALYSIS DIPSTICK
Glucose, UA: NEGATIVE
Nitrite, UA: NEGATIVE
Protein, UA: NEGATIVE
Spec Grav, UA: 1.025
Urobilinogen, UA: NEGATIVE

## 2013-02-15 LAB — CBC
MCH: 31.4 pg (ref 26.0–34.0)
MCHC: 33.2 g/dL (ref 30.0–36.0)
MCV: 94.4 fL (ref 78.0–100.0)
Platelets: 287 10*3/uL (ref 150–400)
RBC: 3.6 MIL/uL — ABNORMAL LOW (ref 3.87–5.11)

## 2013-02-15 NOTE — Progress Notes (Signed)
P 94 Subjective:    Courtney Johns is a 32 y.o. female being seen today for her obstetrical visit. She is at 106w4d gestation. Patient reports nausea, occasional contractions and some nasal bleeding. Fetal movement: normal.  Menstrual History: OB History   Grav Para Term Preterm Abortions TAB SAB Ect Mult Living   2 1 1  0 0 0 0 0 0 1      Menarche age: not asked  Patient's last menstrual period was 07/30/2012.    The following portions of the patient's history were reviewed and updated as appropriate: allergies, current medications, past family history, past medical history, past social history, past surgical history and problem list.  Review of Systems Pertinent items are noted in HPI.   Objective:    BP 111/71  Temp(Src) 97.1 F (36.2 C)  Wt 166 lb (75.297 kg)  LMP 07/30/2012 FHT:  150 BPM  Uterine Size: size equals dates  Presentation: unsure     Assessment:    Pregnancy 28 and 4/7 weeks   Plan:    28-week labs reviewed, normal Done Follow up in 2 Weeks.

## 2013-02-16 LAB — GLUCOSE TOLERANCE, 2 HOURS W/ 1HR: Glucose, Fasting: 55 mg/dL — ABNORMAL LOW (ref 70–99)

## 2013-02-28 ENCOUNTER — Encounter: Payer: Self-pay | Admitting: Obstetrics

## 2013-03-01 ENCOUNTER — Inpatient Hospital Stay (HOSPITAL_COMMUNITY): Admission: AD | Admit: 2013-03-01 | Payer: Medicaid Other | Source: Ambulatory Visit | Admitting: Obstetrics

## 2013-03-02 ENCOUNTER — Ambulatory Visit (INDEPENDENT_AMBULATORY_CARE_PROVIDER_SITE_OTHER): Payer: Medicaid Other | Admitting: Obstetrics

## 2013-03-02 ENCOUNTER — Encounter: Payer: Self-pay | Admitting: Obstetrics

## 2013-03-02 VITALS — BP 100/68 | Temp 97.9°F | Wt 166.0 lb

## 2013-03-02 DIAGNOSIS — Z3480 Encounter for supervision of other normal pregnancy, unspecified trimester: Secondary | ICD-10-CM

## 2013-03-02 DIAGNOSIS — Z3483 Encounter for supervision of other normal pregnancy, third trimester: Secondary | ICD-10-CM

## 2013-03-02 DIAGNOSIS — O365931 Maternal care for other known or suspected poor fetal growth, third trimester, fetus 1: Secondary | ICD-10-CM

## 2013-03-02 DIAGNOSIS — O365990 Maternal care for other known or suspected poor fetal growth, unspecified trimester, not applicable or unspecified: Secondary | ICD-10-CM

## 2013-03-02 LAB — POCT URINALYSIS DIPSTICK
Blood, UA: NEGATIVE
Glucose, UA: NEGATIVE
Ketones, UA: NEGATIVE
Spec Grav, UA: 1.015

## 2013-03-02 NOTE — Progress Notes (Signed)
Pulse 94 Pt states that she is doing well.

## 2013-03-23 ENCOUNTER — Ambulatory Visit (INDEPENDENT_AMBULATORY_CARE_PROVIDER_SITE_OTHER): Payer: Medicaid Other | Admitting: Obstetrics

## 2013-03-23 ENCOUNTER — Encounter: Payer: Self-pay | Admitting: Obstetrics

## 2013-03-23 VITALS — BP 108/72 | Temp 97.0°F | Wt 170.0 lb

## 2013-03-23 DIAGNOSIS — Z3480 Encounter for supervision of other normal pregnancy, unspecified trimester: Secondary | ICD-10-CM

## 2013-03-23 DIAGNOSIS — Z3483 Encounter for supervision of other normal pregnancy, third trimester: Secondary | ICD-10-CM

## 2013-03-23 LAB — POCT URINALYSIS DIPSTICK
Bilirubin, UA: NEGATIVE
GLUCOSE UA: NEGATIVE
Ketones, UA: NEGATIVE
NITRITE UA: NEGATIVE
PH UA: 5
PROTEIN UA: NEGATIVE
RBC UA: NEGATIVE
SPEC GRAV UA: 1.025
UROBILINOGEN UA: NEGATIVE

## 2013-03-23 NOTE — Progress Notes (Signed)
Pulse- 92  Patient states she had some abdominal tightening yesterday. Patients states that they didn't last long.

## 2013-03-29 ENCOUNTER — Ambulatory Visit (INDEPENDENT_AMBULATORY_CARE_PROVIDER_SITE_OTHER): Payer: Medicaid Other

## 2013-03-29 DIAGNOSIS — O365931 Maternal care for other known or suspected poor fetal growth, third trimester, fetus 1: Secondary | ICD-10-CM

## 2013-03-29 DIAGNOSIS — Z1389 Encounter for screening for other disorder: Secondary | ICD-10-CM

## 2013-03-29 DIAGNOSIS — O365990 Maternal care for other known or suspected poor fetal growth, unspecified trimester, not applicable or unspecified: Secondary | ICD-10-CM

## 2013-03-30 ENCOUNTER — Encounter: Payer: Self-pay | Admitting: Obstetrics & Gynecology

## 2013-03-30 LAB — US OB DETAIL + 14 WK

## 2013-04-06 ENCOUNTER — Encounter: Payer: Self-pay | Admitting: Obstetrics

## 2013-04-06 ENCOUNTER — Ambulatory Visit (INDEPENDENT_AMBULATORY_CARE_PROVIDER_SITE_OTHER): Payer: Medicaid Other | Admitting: Obstetrics

## 2013-04-06 VITALS — BP 106/71 | Temp 97.6°F | Wt 168.0 lb

## 2013-04-06 DIAGNOSIS — Z3480 Encounter for supervision of other normal pregnancy, unspecified trimester: Secondary | ICD-10-CM

## 2013-04-06 LAB — POCT URINALYSIS DIPSTICK
BILIRUBIN UA: NEGATIVE
Blood, UA: NEGATIVE
GLUCOSE UA: NEGATIVE
Ketones, UA: NEGATIVE
NITRITE UA: NEGATIVE
Spec Grav, UA: 1.015
UROBILINOGEN UA: NEGATIVE
pH, UA: 6.5

## 2013-04-06 NOTE — Progress Notes (Signed)
Pulse 101 Pt is doing well.

## 2013-04-06 NOTE — Addendum Note (Signed)
Addended by: Margarita Sermons on: 04/06/2013 05:20 PM   Modules accepted: Orders

## 2013-04-08 LAB — STREP B DNA PROBE: GBSP: POSITIVE

## 2013-04-12 ENCOUNTER — Ambulatory Visit (INDEPENDENT_AMBULATORY_CARE_PROVIDER_SITE_OTHER): Payer: Medicaid Other | Admitting: Obstetrics

## 2013-04-12 ENCOUNTER — Encounter: Payer: Self-pay | Admitting: Obstetrics

## 2013-04-12 VITALS — BP 100/69 | Temp 97.5°F | Wt 171.0 lb

## 2013-04-12 DIAGNOSIS — Z3480 Encounter for supervision of other normal pregnancy, unspecified trimester: Secondary | ICD-10-CM

## 2013-04-12 LAB — POCT URINALYSIS DIPSTICK
Bilirubin, UA: NEGATIVE
Blood, UA: NEGATIVE
Glucose, UA: NEGATIVE
Ketones, UA: NEGATIVE
Nitrite, UA: NEGATIVE
PH UA: 6
Spec Grav, UA: 1.02
UROBILINOGEN UA: NEGATIVE

## 2013-04-12 NOTE — Progress Notes (Signed)
Pulse 83 Pt is doing well. Pt was advised of positive GBS results from last visit.

## 2013-04-19 ENCOUNTER — Ambulatory Visit (INDEPENDENT_AMBULATORY_CARE_PROVIDER_SITE_OTHER): Payer: Medicaid Other | Admitting: Obstetrics

## 2013-04-19 ENCOUNTER — Encounter: Payer: Self-pay | Admitting: Obstetrics

## 2013-04-19 VITALS — BP 122/77 | Temp 97.5°F | Wt 170.0 lb

## 2013-04-19 DIAGNOSIS — Z3480 Encounter for supervision of other normal pregnancy, unspecified trimester: Secondary | ICD-10-CM

## 2013-04-19 LAB — POCT URINALYSIS DIPSTICK
BILIRUBIN UA: NEGATIVE
Glucose, UA: NEGATIVE
KETONES UA: NEGATIVE
NITRITE UA: NEGATIVE
PH UA: 6
RBC UA: NEGATIVE
Spec Grav, UA: 1.02
Urobilinogen, UA: NEGATIVE

## 2013-04-19 NOTE — Progress Notes (Signed)
Pulse: 99 

## 2013-04-26 ENCOUNTER — Encounter: Payer: Self-pay | Admitting: Obstetrics

## 2013-04-26 ENCOUNTER — Ambulatory Visit (INDEPENDENT_AMBULATORY_CARE_PROVIDER_SITE_OTHER): Payer: Medicaid Other | Admitting: Obstetrics

## 2013-04-26 VITALS — BP 118/70 | Temp 97.0°F | Wt 168.0 lb

## 2013-04-26 DIAGNOSIS — Z3480 Encounter for supervision of other normal pregnancy, unspecified trimester: Secondary | ICD-10-CM

## 2013-04-26 LAB — POCT URINALYSIS DIPSTICK
BILIRUBIN UA: NEGATIVE
GLUCOSE UA: NEGATIVE
Ketones, UA: NEGATIVE
Leukocytes, UA: NEGATIVE
NITRITE UA: NEGATIVE
PH UA: 5
RBC UA: NEGATIVE
SPEC GRAV UA: 1.025
Urobilinogen, UA: NEGATIVE

## 2013-04-26 NOTE — Progress Notes (Signed)
Pulse: 90

## 2013-05-03 ENCOUNTER — Ambulatory Visit (INDEPENDENT_AMBULATORY_CARE_PROVIDER_SITE_OTHER): Payer: Medicaid Other | Admitting: Obstetrics

## 2013-05-03 ENCOUNTER — Encounter: Payer: Self-pay | Admitting: Obstetrics

## 2013-05-03 VITALS — BP 120/86 | Temp 98.0°F | Wt 166.0 lb

## 2013-05-03 DIAGNOSIS — Z3480 Encounter for supervision of other normal pregnancy, unspecified trimester: Secondary | ICD-10-CM

## 2013-05-03 DIAGNOSIS — J9801 Acute bronchospasm: Secondary | ICD-10-CM

## 2013-05-03 DIAGNOSIS — J310 Chronic rhinitis: Secondary | ICD-10-CM

## 2013-05-03 LAB — POCT URINALYSIS DIPSTICK
Bilirubin, UA: NEGATIVE
Blood, UA: NEGATIVE
Glucose, UA: NEGATIVE
Ketones, UA: NEGATIVE
NITRITE UA: NEGATIVE
Spec Grav, UA: 1.015
UROBILINOGEN UA: NEGATIVE
pH, UA: 5

## 2013-05-03 MED ORDER — AZITHROMYCIN 250 MG PO TABS: ORAL_TABLET | ORAL | Status: DC

## 2013-05-03 MED ORDER — ROBITUSSIN TO GO COUGH/COLD CF 5-10-100 MG/5ML PO LIQD: ORAL | Status: DC

## 2013-05-03 NOTE — Progress Notes (Signed)
Pulse: 96 Patient states she has been sick for 3 days now. Patient states she has been taking sudafed. Itchy eyes, runny nose, sore throat, and loss of appetite. Patient denies any concerns.

## 2013-05-10 ENCOUNTER — Encounter (HOSPITAL_COMMUNITY): Payer: Self-pay | Admitting: *Deleted

## 2013-05-10 ENCOUNTER — Encounter: Payer: Medicaid Other | Admitting: Obstetrics

## 2013-05-10 ENCOUNTER — Inpatient Hospital Stay (HOSPITAL_COMMUNITY)
Admission: AD | Admit: 2013-05-10 | Discharge: 2013-05-10 | Disposition: A | Discharge status: Home / Self Care | Payer: Medicaid Other | Source: Ambulatory Visit | Attending: Obstetrics & Gynecology | Admitting: Obstetrics & Gynecology

## 2013-05-10 DIAGNOSIS — O34219 Maternal care for unspecified type scar from previous cesarean delivery: Secondary | ICD-10-CM | POA: Insufficient documentation

## 2013-05-10 DIAGNOSIS — O479 False labor, unspecified: Secondary | ICD-10-CM | POA: Insufficient documentation

## 2013-05-10 LAB — OB RESULTS CONSOLE GC/CHLAMYDIA
Chlamydia: NEGATIVE
Gonorrhea: NEGATIVE

## 2013-05-10 NOTE — Progress Notes (Signed)
2nd reviewer T Morris RN.  Rolanda Jay RN

## 2013-05-10 NOTE — Discharge Instructions (Signed)
Braxton Hicks Contractions Pregnancy is commonly associated with contractions of the uterus throughout the pregnancy. Towards the end of pregnancy (32 to 34 weeks), these contractions (Braxton Hicks) can develop more often and may become more forceful. This is not true labor because these contractions do not result in opening (dilatation) and thinning of the cervix. They are sometimes difficult to tell apart from true labor because these contractions can be forceful and people have different pain tolerances. You should not feel embarrassed if you go to the hospital with false labor. Sometimes, the only way to tell if you are in true labor is for your caregiver to follow the changes in the cervix. How to tell the difference between true and false labor:  False labor.  The contractions of false labor are usually shorter, irregular and not as hard as those of true labor.  They are often felt in the front of the lower abdomen and in the groin.  They may leave with walking around or changing positions while lying down.  They get weaker and are shorter lasting as time goes on.  These contractions are usually irregular.  They do not usually become progressively stronger, regular and closer together as with true labor.  True labor.  Contractions in true labor last 30 to 70 seconds, become very regular, usually become more intense, and increase in frequency.  They do not go away with walking.  The discomfort is usually felt in the top of the uterus and spreads to the lower abdomen and low back.  True labor can be determined by your caregiver with an exam. This will show that the cervix is dilating and getting thinner. If there are no prenatal problems or other health problems associated with the pregnancy, it is completely safe to be sent home with false labor and await the onset of true labor. HOME CARE INSTRUCTIONS   Keep up with your usual exercises and instructions.  Take medications as  directed.  Keep your regular prenatal appointment.  Eat and drink lightly if you think you are going into labor.  If BH contractions are making you uncomfortable:  Change your activity position from lying down or resting to walking/walking to resting.  Sit and rest in a tub of warm water.  Drink 2 to 3 glasses of water. Dehydration may cause B-H contractions.  Do slow and deep breathing several times an hour. SEEK IMMEDIATE MEDICAL CARE IF:   Your contractions continue to become stronger, more regular, and closer together.  You have a gushing, burst or leaking of fluid from the vagina.  An oral temperature above 102 F (38.9 C) develops.  You have passage of blood-tinged mucus.  You develop vaginal bleeding.  You develop continuous belly (abdominal) pain.  You have low back pain that you never had before.  You feel the baby's head pushing down causing pelvic pressure.  The baby is not moving as much as it used to. Document Released: 03/02/2005 Document Revised: 05/25/2011 Document Reviewed: 12/12/2012 [employer] Patient Information 2014 [employer].  Fetal Movement Counts Patient Name: __________________________________________________ Patient Due Date: ____________________ Performing a fetal movement count is highly recommended in high-risk pregnancies, but it is good for every pregnant woman to do. Your caregiver may ask you to start counting fetal movements at 28 weeks of the pregnancy. Fetal movements often increase:  After eating a full meal.  After physical activity.  After eating or drinking something sweet or cold.  At rest. Pay attention to when you feel   the baby is most active. This will help you notice a pattern of your baby's sleep and wake cycles and what factors contribute to an increase in fetal movement. It is important to perform a fetal movement count at the same time each day when your baby is normally most active.  HOW TO COUNT FETAL  MOVEMENTS 1. Find a quiet and comfortable area to sit or lie down on your left side. Lying on your left side provides the best blood and oxygen circulation to your baby. 2. Write down the day and time on a sheet of paper or in a journal. 3. Start counting kicks, flutters, swishes, rolls, or jabs in a 2 hour period. You should feel at least 10 movements within 2 hours. 4. If you do not feel 10 movements in 2 hours, wait 2 3 hours and count again. Look for a change in the pattern or not enough counts in 2 hours. SEEK MEDICAL CARE IF:  You feel less than 10 counts in 2 hours, tried twice.  There is no movement in over an hour.  The pattern is changing or taking longer each day to reach 10 counts in 2 hours.  You feel the baby is not moving as he or she usually does. Date: ____________ Movements: ____________ Start time: ____________ Finish time: ____________  Date: ____________ Movements: ____________ Start time: ____________ Finish time: ____________ Date: ____________ Movements: ____________ Start time: ____________ Finish time: ____________ Date: ____________ Movements: ____________ Start time: ____________ Finish time: ____________ Date: ____________ Movements: ____________ Start time: ____________ Finish time: ____________ Date: ____________ Movements: ____________ Start time: ____________ Finish time: ____________ Date: ____________ Movements: ____________ Start time: ____________ Finish time: ____________ Date: ____________ Movements: ____________ Start time: ____________ Finish time: ____________  Date: ____________ Movements: ____________ Start time: ____________ Finish time: ____________ Date: ____________ Movements: ____________ Start time: ____________ Finish time: ____________ Date: ____________ Movements: ____________ Start time: ____________ Finish time: ____________ Date: ____________ Movements: ____________ Start time: ____________ Finish time: ____________ Date: ____________  Movements: ____________ Start time: ____________ Finish time: ____________ Date: ____________ Movements: ____________ Start time: ____________ Finish time: ____________ Date: ____________ Movements: ____________ Start time: ____________ Finish time: ____________  Date: ____________ Movements: ____________ Start time: ____________ Finish time: ____________ Date: ____________ Movements: ____________ Start time: ____________ Finish time: ____________ Date: ____________ Movements: ____________ Start time: ____________ Finish time: ____________ Date: ____________ Movements: ____________ Start time: ____________ Finish time: ____________ Date: ____________ Movements: ____________ Start time: ____________ Finish time: ____________ Date: ____________ Movements: ____________ Start time: ____________ Finish time: ____________ Date: ____________ Movements: ____________ Start time: ____________ Finish time: ____________  Date: ____________ Movements: ____________ Start time: ____________ Finish time: ____________ Date: ____________ Movements: ____________ Start time: ____________ Finish time: ____________ Date: ____________ Movements: ____________ Start time: ____________ Finish time: ____________ Date: ____________ Movements: ____________ Start time: ____________ Finish time: ____________ Date: ____________ Movements: ____________ Start time: ____________ Finish time: ____________ Date: ____________ Movements: ____________ Start time: ____________ Finish time: ____________ Date: ____________ Movements: ____________ Start time: ____________ Finish time: ____________  Date: ____________ Movements: ____________ Start time: ____________ Finish time: ____________ Date: ____________ Movements: ____________ Start time: ____________ Finish time: ____________ Date: ____________ Movements: ____________ Start time: ____________ Finish time: ____________ Date: ____________ Movements: ____________ Start time:  ____________ Finish time: ____________ Date: ____________ Movements: ____________ Start time: ____________ Finish time: ____________ Date: ____________ Movements: ____________ Start time: ____________ Finish time: ____________ Date: ____________ Movements: ____________ Start time: ____________ Finish time: ____________  Date: ____________ Movements: ____________ Start time: ____________ Finish time: ____________ Date: ____________ Movements: ____________ Start   time: ____________ Finish time: ____________ Date: ____________ Movements: ____________ Start time: ____________ Finish time: ____________ Date: ____________ Movements: ____________ Start time: ____________ Finish time: ____________ Date: ____________ Movements: ____________ Start time: ____________ Finish time: ____________ Date: ____________ Movements: ____________ Start time: ____________ Finish time: ____________ Date: ____________ Movements: ____________ Start time: ____________ Finish time: ____________  Date: ____________ Movements: ____________ Start time: ____________ Finish time: ____________ Date: ____________ Movements: ____________ Start time: ____________ Finish time: ____________ Date: ____________ Movements: ____________ Start time: ____________ Finish time: ____________ Date: ____________ Movements: ____________ Start time: ____________ Finish time: ____________ Date: ____________ Movements: ____________ Start time: ____________ Finish time: ____________ Date: ____________ Movements: ____________ Start time: ____________ Finish time: ____________ Date: ____________ Movements: ____________ Start time: ____________ Finish time: ____________  Date: ____________ Movements: ____________ Start time: ____________ Finish time: ____________ Date: ____________ Movements: ____________ Start time: ____________ Finish time: ____________ Date: ____________ Movements: ____________ Start time: ____________ Finish time: ____________ Date:  ____________ Movements: ____________ Start time: ____________ Finish time: ____________ Date: ____________ Movements: ____________ Start time: ____________ Finish time: ____________ Date: ____________ Movements: ____________ Start time: ____________ Finish time: ____________ Document Released: 04/01/2006 Document Revised: 02/17/2012 Document Reviewed: 12/28/2011 [employer] Patient Information 2014 [employer].  

## 2013-05-10 NOTE — MAU Note (Signed)
Report given to Geryl Rankins, CCC/BS. Patient will be triaged in room 176

## 2013-05-10 NOTE — MAU Note (Signed)
Patient states she is having contractions every 10 minutes. Denies bleeding but has a light vaginal discharge that does not require wearing a pad. Reports good fetal movement. Patient has had a previous cesarean section and had an appointment today to discuss C/S vs TOLAC.

## 2013-05-11 ENCOUNTER — Inpatient Hospital Stay (HOSPITAL_COMMUNITY)
Admission: AD | Admit: 2013-05-11 | Discharge: 2013-05-11 | Disposition: A | Discharge status: Home / Self Care | Payer: Medicaid Other | Source: Ambulatory Visit | Attending: Obstetrics | Admitting: Obstetrics

## 2013-05-11 ENCOUNTER — Encounter (HOSPITAL_COMMUNITY): Payer: Self-pay | Admitting: General Practice

## 2013-05-11 DIAGNOSIS — O479 False labor, unspecified: Secondary | ICD-10-CM | POA: Insufficient documentation

## 2013-05-11 NOTE — MAU Note (Signed)
contractions 

## 2013-05-11 NOTE — Discharge Instructions (Signed)

## 2013-05-12 ENCOUNTER — Inpatient Hospital Stay (HOSPITAL_COMMUNITY): Payer: Medicaid Other | Admitting: Anesthesiology

## 2013-05-12 ENCOUNTER — Inpatient Hospital Stay (HOSPITAL_COMMUNITY)
Admission: AD | Admit: 2013-05-12 | Discharge: 2013-05-15 | DRG: 766 | Disposition: A | Discharge status: Home / Self Care | Payer: Medicaid Other | Source: Ambulatory Visit | Attending: Obstetrics | Admitting: Obstetrics

## 2013-05-12 ENCOUNTER — Encounter (HOSPITAL_COMMUNITY): Payer: Self-pay | Admitting: *Deleted

## 2013-05-12 ENCOUNTER — Encounter (HOSPITAL_COMMUNITY): Payer: Medicaid Other | Admitting: Anesthesiology

## 2013-05-12 DIAGNOSIS — O9903 Anemia complicating the puerperium: Secondary | ICD-10-CM | POA: Diagnosis not present

## 2013-05-12 DIAGNOSIS — Z22330 Carrier of Group B streptococcus: Secondary | ICD-10-CM

## 2013-05-12 DIAGNOSIS — IMO0001 Reserved for inherently not codable concepts without codable children: Secondary | ICD-10-CM

## 2013-05-12 DIAGNOSIS — D649 Anemia, unspecified: Secondary | ICD-10-CM | POA: Diagnosis not present

## 2013-05-12 DIAGNOSIS — O34219 Maternal care for unspecified type scar from previous cesarean delivery: Secondary | ICD-10-CM | POA: Diagnosis present

## 2013-05-12 DIAGNOSIS — Z98891 History of uterine scar from previous surgery: Secondary | ICD-10-CM

## 2013-05-12 DIAGNOSIS — K219 Gastro-esophageal reflux disease without esophagitis: Secondary | ICD-10-CM | POA: Diagnosis present

## 2013-05-12 LAB — CBC
HCT: 33.2 % — ABNORMAL LOW (ref 36.0–46.0)
Hemoglobin: 11.4 g/dL — ABNORMAL LOW (ref 12.0–15.0)
MCH: 30.8 pg (ref 26.0–34.0)
MCHC: 34.3 g/dL (ref 30.0–36.0)
MCV: 89.7 fL (ref 78.0–100.0)
PLATELETS: 291 10*3/uL (ref 150–400)
RBC: 3.7 MIL/uL — ABNORMAL LOW (ref 3.87–5.11)
RDW: 12.9 % (ref 11.5–15.5)
WBC: 10.6 10*3/uL — ABNORMAL HIGH (ref 4.0–10.5)

## 2013-05-12 LAB — RPR: RPR: NONREACTIVE

## 2013-05-12 MED ORDER — IBUPROFEN 600 MG PO TABS: 600.0000 mg | ORAL_TABLET | Freq: Four times a day (QID) | ORAL | Status: DC | PRN

## 2013-05-12 MED ORDER — OXYCODONE-ACETAMINOPHEN 5-325 MG PO TABS: 1.0000 | ORAL_TABLET | ORAL | Status: DC | PRN

## 2013-05-12 MED ORDER — LACTATED RINGERS IV SOLN: 500.0000 mL | INTRAVENOUS | Status: DC | PRN

## 2013-05-12 MED ORDER — FENTANYL 2.5 MCG/ML BUPIVACAINE 1/10 % EPIDURAL INFUSION (WH - ANES)
14.0000 mL/h | INTRAMUSCULAR | Status: DC | PRN
  Administered 2013-05-12 – 2013-05-13 (×3): 14 mL/h via EPIDURAL
  Filled 2013-05-12 (×4): qty 125

## 2013-05-12 MED ORDER — OXYTOCIN 40 UNITS IN LACTATED RINGERS INFUSION - SIMPLE MED
1.0000 m[IU]/min | INTRAVENOUS | Status: DC
  Administered 2013-05-12: 5 m[IU]/min via INTRAVENOUS
  Administered 2013-05-12: 6 m[IU]/min via INTRAVENOUS
  Administered 2013-05-12: 7 m[IU]/min via INTRAVENOUS
  Administered 2013-05-12: 4 m[IU]/min via INTRAVENOUS
  Administered 2013-05-12: 2 m[IU]/min via INTRAVENOUS
  Administered 2013-05-12: 1 m[IU]/min via INTRAVENOUS
  Administered 2013-05-12: 8 m[IU]/min via INTRAVENOUS
  Administered 2013-05-12: 3 m[IU]/min via INTRAVENOUS
  Filled 2013-05-12: qty 1000

## 2013-05-12 MED ORDER — OXYTOCIN BOLUS FROM INFUSION: 500.0000 mL | INTRAVENOUS | Status: DC

## 2013-05-12 MED ORDER — EPHEDRINE 5 MG/ML INJ: 10.0000 mg | INTRAVENOUS | Status: DC | PRN

## 2013-05-12 MED ORDER — PHENYLEPHRINE 40 MCG/ML (10ML) SYRINGE FOR IV PUSH (FOR BLOOD PRESSURE SUPPORT): 80.0000 ug | PREFILLED_SYRINGE | INTRAVENOUS | Status: DC | PRN

## 2013-05-12 MED ORDER — ONDANSETRON HCL 4 MG/2ML IJ SOLN: 4.0000 mg | Freq: Four times a day (QID) | INTRAMUSCULAR | Status: DC | PRN

## 2013-05-12 MED ORDER — PENICILLIN G POTASSIUM 5000000 UNITS IJ SOLR
2.5000 10*6.[IU] | INTRAVENOUS | Status: DC
  Administered 2013-05-12 – 2013-05-13 (×6): 2.5 10*6.[IU] via INTRAVENOUS
  Filled 2013-05-12 (×9): qty 2.5

## 2013-05-12 MED ORDER — OXYTOCIN 40 UNITS IN LACTATED RINGERS INFUSION - SIMPLE MED: 62.5000 mL/h | INTRAVENOUS | Status: DC

## 2013-05-12 MED ORDER — ACETAMINOPHEN 325 MG PO TABS: 650.0000 mg | ORAL_TABLET | ORAL | Status: DC | PRN

## 2013-05-12 MED ORDER — LACTATED RINGERS IV SOLN
INTRAVENOUS | Status: DC
  Administered 2013-05-12 (×3): via INTRAVENOUS

## 2013-05-12 MED ORDER — DIPHENHYDRAMINE HCL 50 MG/ML IJ SOLN
12.5000 mg | INTRAMUSCULAR | Status: DC | PRN
  Administered 2013-05-12 – 2013-05-13 (×2): 12.5 mg via INTRAVENOUS
  Filled 2013-05-12 (×2): qty 1

## 2013-05-12 MED ORDER — FLEET ENEMA 7-19 GM/118ML RE ENEM: 1.0000 | ENEMA | RECTAL | Status: DC | PRN

## 2013-05-12 MED ORDER — LIDOCAINE HCL (PF) 1 % IJ SOLN
30.0000 mL | INTRAMUSCULAR | Status: DC | PRN
  Filled 2013-05-12: qty 30

## 2013-05-12 MED ORDER — EPHEDRINE 5 MG/ML INJ
10.0000 mg | INTRAVENOUS | Status: DC | PRN
  Filled 2013-05-12: qty 4

## 2013-05-12 MED ORDER — LIDOCAINE HCL (PF) 1 % IJ SOLN
INTRAMUSCULAR | Status: DC | PRN
  Administered 2013-05-12 (×2): 5 mL

## 2013-05-12 MED ORDER — LACTATED RINGERS IV SOLN
500.0000 mL | Freq: Once | INTRAVENOUS | Status: AC
  Administered 2013-05-12: 500 mL via INTRAVENOUS

## 2013-05-12 MED ORDER — CITRIC ACID-SODIUM CITRATE 334-500 MG/5ML PO SOLN
30.0000 mL | ORAL | Status: DC | PRN
  Administered 2013-05-13: 30 mL via ORAL
  Filled 2013-05-12: qty 15

## 2013-05-12 MED ORDER — PHENYLEPHRINE 40 MCG/ML (10ML) SYRINGE FOR IV PUSH (FOR BLOOD PRESSURE SUPPORT)
80.0000 ug | PREFILLED_SYRINGE | INTRAVENOUS | Status: DC | PRN
  Filled 2013-05-12: qty 10

## 2013-05-12 MED ORDER — PENICILLIN G POTASSIUM 5000000 UNITS IJ SOLR
5.0000 10*6.[IU] | Freq: Once | INTRAVENOUS | Status: AC
  Administered 2013-05-12: 5 10*6.[IU] via INTRAVENOUS
  Filled 2013-05-12: qty 5

## 2013-05-12 MED ORDER — TERBUTALINE SULFATE 1 MG/ML IJ SOLN: 0.2500 mg | Freq: Once | INTRAMUSCULAR | Status: AC | PRN

## 2013-05-12 NOTE — MAU Note (Signed)
PT SAYS   [place] PM AT 1100PM.   SAYS UC ARE STRONGER.      DENIES HSV AND MRSA.

## 2013-05-12 NOTE — Anesthesia Preprocedure Evaluation (Signed)
Anesthesia Evaluation  Patient identified by MRN, date of birth, ID band Patient awake    Reviewed: Allergy & Precautions, H&P , Patient's Chart, lab work & pertinent test results  Airway Mallampati: II TM Distance: >3 FB Neck ROM: full    Dental   Pulmonary  breath sounds clear to auscultation        Cardiovascular Rhythm:regular Rate:Normal     Neuro/Psych    GI/Hepatic GERD-  ,  Endo/Other    Renal/GU      Musculoskeletal   Abdominal   Peds  Hematology   Anesthesia Other Findings   Reproductive/Obstetrics (+) Pregnancy                           Anesthesia Physical Anesthesia Plan  ASA: II  Anesthesia Plan: Epidural   Post-op Pain Management:    Induction:   Airway Management Planned:   Additional Equipment:   Intra-op Plan:   Post-operative Plan:   Informed Consent: I have reviewed the patients History and Physical, chart, labs and discussed the procedure including the risks, benefits and alternatives for the proposed anesthesia with the patient or authorized representative who has indicated his/her understanding and acceptance.     Plan Discussed with:   Anesthesia Plan Comments:         Anesthesia Quick Evaluation  

## 2013-05-12 NOTE — Anesthesia Procedure Notes (Signed)
Epidural Patient location during procedure: OB Start time: 05/12/2013 6:09 AM  Staffing Anesthesiologist: Rudean Curt Performed by: anesthesiologist   Preanesthetic Checklist Completed: patient identified, site marked, surgical consent, pre-op evaluation, timeout performed, IV checked, risks and benefits discussed and monitors and equipment checked  Epidural Patient position: sitting Prep: site prepped and draped and DuraPrep Patient monitoring: continuous pulse ox and blood pressure Approach: midline Injection technique: LOR air  Needle:  Needle type: Tuohy  Needle gauge: 17 G Needle length: 9 cm and 9 Needle insertion depth: 5 cm cm Catheter type: closed end flexible Catheter size: 19 Gauge Catheter at skin depth: 10 cm Test dose: negative  Assessment Events: blood not aspirated, injection not painful, no injection resistance, negative IV test and no paresthesia  Additional Notes Patient identified.  Risk benefits discussed including failed block, incomplete pain control, headache, nerve damage, paralysis, blood pressure changes, nausea, vomiting, reactions to medication both toxic or allergic, and postpartum back pain.  Patient expressed understanding and wished to proceed.  All questions were answered.  Sterile technique used throughout procedure and epidural site dressed with sterile barrier dressing. No paresthesia or other complications noted.The patient did not experience any signs of intravascular injection such as tinnitus or metallic taste in mouth nor signs of intrathecal spread such as rapid motor block. Please see nursing notes for vital signs.

## 2013-05-12 NOTE — H&P (Signed)
Courtney Johns is a 33 y.o. female presenting for UC's. Maternal Medical History:  Reason for admission: Contractions.  33 yo G2 P1.  EDC 05-06-13.  Presents with UC's.  Fetal activity: Perceived fetal activity is normal.   Last perceived fetal movement was within the past hour.    Prenatal complications: no prenatal complications Prenatal Complications - Diabetes: none.    OB History   Grav Para Term Preterm Abortions TAB SAB Ect Mult Living   2 1 1  0 0 0 0 0 0 1     Past Medical History  Diagnosis Date  . Fibroid     4CM anterior  . Headache(784.0)   . GERD (gastroesophageal reflux disease)   . Infection     urinary tract infection  . Malaria    Past Surgical History  Procedure Laterality Date  . Breast surgery      R Breast  . Cesarean section  12/24/2011    Procedure: CESAREAN SECTION;  Surgeon: Shelly Bombard, MD;  Location: [facility];  Service: Obstetrics;  Laterality: N/A;   Family History: family history includes Hypertension in her mother. There is no history of Anesthesia problems or Other. Social History:  reports that she has never smoked. She has never used smokeless tobacco. She reports that she does not drink alcohol or use illicit drugs.   Prenatal Transfer Tool  Maternal Diabetes: No Genetic Screening: Normal Maternal Ultrasounds/Referrals: Normal Fetal Ultrasounds or other Referrals:  None Maternal Substance Abuse:  No Significant Maternal Medications:  None Significant Maternal Lab Results:  None Other Comments:  None  Review of Systems  All other systems reviewed and are negative.    Dilation: 1.5 Effacement (%): 70 Station: -1 Exam by:: DCALLAWAY, RN Last menstrual period 07/30/2012. Maternal Exam:  Uterine Assessment: Contraction strength is moderate.  Abdomen: Patient reports no abdominal tenderness. Fetal presentation: vertex  Introitus: Normal vulva. Normal vagina.  Pelvis: adequate for delivery.   Cervix: Cervix evaluated by  digital exam.     Physical Exam  Nursing note and vitals reviewed. Constitutional: She is oriented to person, place, and time. She appears well-developed and well-nourished.  HENT:  Head: Normocephalic and atraumatic.  Eyes: Conjunctivae are normal. Pupils are equal, round, and reactive to light.  Neck: Normal range of motion. Neck supple.  Cardiovascular: Normal rate and regular rhythm.   Respiratory: Effort normal.  GI: Soft.  Genitourinary: Vagina normal and uterus normal.  Musculoskeletal: Normal range of motion.  Neurological: She is alert and oriented to person, place, and time.  Skin: Skin is warm and dry.  Psychiatric: She has a normal mood and affect. Her behavior is normal. Judgment and thought content normal.    Prenatal labs: ABO, Rh: O/POS/-- (09/30 1121) Antibody: NEG (09/30 1121) Rubella: 24.80 (09/30 1121) RPR: NON REAC (12/03 1634)  HBsAg: NEGATIVE (09/30 1121)  HIV: NON REACTIVE (12/03 1634)  GBS: POSITIVE (01/22 1731)   Assessment/Plan: 40.6 weeks.  Early labor-latent phase.  VBAC.  Admit.   Courtney Johns A 05/12/2013, 5:00 AM

## 2013-05-13 ENCOUNTER — Encounter (HOSPITAL_COMMUNITY)
Admission: AD | Disposition: A | Discharge status: Home / Self Care | Payer: Self-pay | Source: Ambulatory Visit | Attending: Obstetrics

## 2013-05-13 ENCOUNTER — Encounter (HOSPITAL_COMMUNITY): Payer: Self-pay | Admitting: *Deleted

## 2013-05-13 DIAGNOSIS — Z98891 History of uterine scar from previous surgery: Secondary | ICD-10-CM

## 2013-05-13 SURGERY — Surgical Case
Anesthesia: Epidural | Site: Abdomen

## 2013-05-13 MED ORDER — PHENYLEPHRINE HCL 10 MG/ML IJ SOLN
INTRAMUSCULAR | Status: DC | PRN
  Administered 2013-05-13: 40 ug via INTRAVENOUS

## 2013-05-13 MED ORDER — SIMETHICONE 80 MG PO CHEW: 80.0000 mg | CHEWABLE_TABLET | ORAL | Status: DC | PRN

## 2013-05-13 MED ORDER — DEXTROSE 5 % IV SOLN
1.0000 ug/kg/h | INTRAVENOUS | Status: DC | PRN
  Filled 2013-05-13: qty 2

## 2013-05-13 MED ORDER — SIMETHICONE 80 MG PO CHEW
80.0000 mg | CHEWABLE_TABLET | Freq: Three times a day (TID) | ORAL | Status: DC
  Administered 2013-05-13 – 2013-05-14 (×3): 80 mg via ORAL
  Filled 2013-05-13 (×4): qty 1

## 2013-05-13 MED ORDER — DIPHENHYDRAMINE HCL 50 MG/ML IJ SOLN
25.0000 mg | INTRAMUSCULAR | Status: DC | PRN
  Administered 2013-05-14: 25 mg via INTRAMUSCULAR
  Filled 2013-05-13 (×2): qty 1

## 2013-05-13 MED ORDER — ONDANSETRON HCL 4 MG/2ML IJ SOLN: 4.0000 mg | Freq: Three times a day (TID) | INTRAMUSCULAR | Status: DC | PRN

## 2013-05-13 MED ORDER — OXYTOCIN 10 UNIT/ML IJ SOLN
INTRAMUSCULAR | Status: AC
  Filled 2013-05-13: qty 4

## 2013-05-13 MED ORDER — MEPERIDINE HCL 25 MG/ML IJ SOLN: 6.2500 mg | INTRAMUSCULAR | Status: DC | PRN

## 2013-05-13 MED ORDER — SODIUM CHLORIDE 0.9 % IJ SOLN: 3.0000 mL | INTRAMUSCULAR | Status: DC | PRN

## 2013-05-13 MED ORDER — LANOLIN HYDROUS EX OINT: 1.0000 "application " | TOPICAL_OINTMENT | CUTANEOUS | Status: DC | PRN

## 2013-05-13 MED ORDER — MORPHINE SULFATE 0.5 MG/ML IJ SOLN
INTRAMUSCULAR | Status: AC
Start: 2013-05-13 — End: 2013-05-13
  Filled 2013-05-13: qty 10

## 2013-05-13 MED ORDER — KETOROLAC TROMETHAMINE 30 MG/ML IJ SOLN
30.0000 mg | Freq: Four times a day (QID) | INTRAMUSCULAR | Status: AC | PRN
  Administered 2013-05-13: 30 mg via INTRAVENOUS

## 2013-05-13 MED ORDER — ONDANSETRON HCL 4 MG/2ML IJ SOLN
INTRAMUSCULAR | Status: AC
  Filled 2013-05-13: qty 2

## 2013-05-13 MED ORDER — DIBUCAINE 1 % RE OINT: 1.0000 "application " | TOPICAL_OINTMENT | RECTAL | Status: DC | PRN

## 2013-05-13 MED ORDER — DIPHENHYDRAMINE HCL 50 MG/ML IJ SOLN: 12.5000 mg | INTRAMUSCULAR | Status: DC | PRN

## 2013-05-13 MED ORDER — METOCLOPRAMIDE HCL 5 MG/ML IJ SOLN: 10.0000 mg | Freq: Three times a day (TID) | INTRAMUSCULAR | Status: DC | PRN

## 2013-05-13 MED ORDER — SCOPOLAMINE 1 MG/3DAYS TD PT72
1.0000 | MEDICATED_PATCH | Freq: Once | TRANSDERMAL | Status: DC
  Administered 2013-05-13: 1.5 mg via TRANSDERMAL

## 2013-05-13 MED ORDER — NALBUPHINE HCL 10 MG/ML IJ SOLN
5.0000 mg | INTRAMUSCULAR | Status: DC | PRN
  Administered 2013-05-13 (×2): 10 mg via INTRAVENOUS
  Filled 2013-05-13 (×3): qty 1

## 2013-05-13 MED ORDER — LACTATED RINGERS IV SOLN
INTRAVENOUS | Status: DC | PRN
  Administered 2013-05-13: 11:00:00 via INTRAVENOUS

## 2013-05-13 MED ORDER — ONDANSETRON HCL 4 MG/2ML IJ SOLN: 4.0000 mg | INTRAMUSCULAR | Status: DC | PRN

## 2013-05-13 MED ORDER — DIPHENHYDRAMINE HCL 25 MG PO CAPS
25.0000 mg | ORAL_CAPSULE | ORAL | Status: DC | PRN
Start: 2013-05-13 — End: 2013-05-15
  Administered 2013-05-14: 25 mg via ORAL
  Filled 2013-05-13: qty 1

## 2013-05-13 MED ORDER — DIPHENHYDRAMINE HCL 25 MG PO CAPS: 25.0000 mg | ORAL_CAPSULE | Freq: Four times a day (QID) | ORAL | Status: DC | PRN

## 2013-05-13 MED ORDER — SODIUM BICARBONATE 8.4 % IV SOLN
INTRAVENOUS | Status: DC | PRN
  Administered 2013-05-13 (×2): 5 mL via EPIDURAL

## 2013-05-13 MED ORDER — WITCH HAZEL-GLYCERIN EX PADS: 1.0000 "application " | MEDICATED_PAD | CUTANEOUS | Status: DC | PRN

## 2013-05-13 MED ORDER — FENTANYL CITRATE 0.05 MG/ML IJ SOLN
25.0000 ug | INTRAMUSCULAR | Status: DC | PRN
  Administered 2013-05-13: 25 ug via INTRAVENOUS

## 2013-05-13 MED ORDER — FENTANYL CITRATE 0.05 MG/ML IJ SOLN
INTRAMUSCULAR | Status: AC
  Administered 2013-05-13: 25 ug via INTRAVENOUS
  Filled 2013-05-13: qty 2

## 2013-05-13 MED ORDER — INFLUENZA VAC SPLIT QUAD 0.5 ML IM SUSP
0.5000 mL | INTRAMUSCULAR | Status: AC
  Administered 2013-05-14: 0.5 mL via INTRAMUSCULAR
  Filled 2013-05-13: qty 0.5

## 2013-05-13 MED ORDER — CEFAZOLIN SODIUM-DEXTROSE 2-3 GM-% IV SOLR
2.0000 g | Freq: Three times a day (TID) | INTRAVENOUS | Status: DC
  Administered 2013-05-13: 2 g via INTRAVENOUS
  Filled 2013-05-13 (×3): qty 50

## 2013-05-13 MED ORDER — SENNOSIDES-DOCUSATE SODIUM 8.6-50 MG PO TABS
2.0000 | ORAL_TABLET | ORAL | Status: DC
  Administered 2013-05-14 (×2): 2 via ORAL
  Filled 2013-05-13 (×2): qty 2

## 2013-05-13 MED ORDER — MENTHOL 3 MG MT LOZG: 1.0000 | LOZENGE | OROMUCOSAL | Status: DC | PRN

## 2013-05-13 MED ORDER — OXYTOCIN 40 UNITS IN LACTATED RINGERS INFUSION - SIMPLE MED: 62.5000 mL/h | INTRAVENOUS | Status: AC

## 2013-05-13 MED ORDER — NALOXONE HCL 0.4 MG/ML IJ SOLN: 0.4000 mg | INTRAMUSCULAR | Status: DC | PRN

## 2013-05-13 MED ORDER — KETOROLAC TROMETHAMINE 30 MG/ML IJ SOLN: 30.0000 mg | Freq: Four times a day (QID) | INTRAMUSCULAR | Status: AC | PRN

## 2013-05-13 MED ORDER — SIMETHICONE 80 MG PO CHEW
80.0000 mg | CHEWABLE_TABLET | ORAL | Status: DC
  Administered 2013-05-14 (×2): 80 mg via ORAL
  Filled 2013-05-13 (×2): qty 1

## 2013-05-13 MED ORDER — NALBUPHINE HCL 10 MG/ML IJ SOLN
5.0000 mg | INTRAMUSCULAR | Status: DC | PRN
  Administered 2013-05-14 (×3): 10 mg via SUBCUTANEOUS
  Filled 2013-05-13 (×2): qty 1

## 2013-05-13 MED ORDER — SCOPOLAMINE 1 MG/3DAYS TD PT72
MEDICATED_PATCH | TRANSDERMAL | Status: AC
Start: 2013-05-13 — End: 2013-05-14
  Filled 2013-05-13: qty 1

## 2013-05-13 MED ORDER — ONDANSETRON HCL 4 MG PO TABS: 4.0000 mg | ORAL_TABLET | ORAL | Status: DC | PRN

## 2013-05-13 MED ORDER — ZOLPIDEM TARTRATE 5 MG PO TABS: 5.0000 mg | ORAL_TABLET | Freq: Every evening | ORAL | Status: DC | PRN

## 2013-05-13 MED ORDER — TETANUS-DIPHTH-ACELL PERTUSSIS 5-2.5-18.5 LF-MCG/0.5 IM SUSP: 0.5000 mL | Freq: Once | INTRAMUSCULAR | Status: DC

## 2013-05-13 MED ORDER — PRENATAL MULTIVITAMIN CH
1.0000 | ORAL_TABLET | Freq: Every day | ORAL | Status: DC
  Administered 2013-05-14: 1 via ORAL
  Filled 2013-05-13: qty 1

## 2013-05-13 MED ORDER — MORPHINE SULFATE (PF) 0.5 MG/ML IJ SOLN
INTRAMUSCULAR | Status: DC | PRN
  Administered 2013-05-13: 5 mg via EPIDURAL

## 2013-05-13 MED ORDER — KETOROLAC TROMETHAMINE 30 MG/ML IJ SOLN
INTRAMUSCULAR | Status: AC
  Administered 2013-05-13: 30 mg via INTRAVENOUS
  Filled 2013-05-13: qty 1

## 2013-05-13 MED ORDER — LACTATED RINGERS IV SOLN
INTRAVENOUS | Status: DC
  Administered 2013-05-14: via INTRAVENOUS

## 2013-05-13 MED ORDER — OXYTOCIN 10 UNIT/ML IJ SOLN
40.0000 [IU] | INTRAVENOUS | Status: DC | PRN
  Administered 2013-05-13: 40 [IU] via INTRAVENOUS

## 2013-05-13 MED ORDER — ONDANSETRON HCL 4 MG/2ML IJ SOLN
INTRAMUSCULAR | Status: DC | PRN
  Administered 2013-05-13: 4 mg via INTRAVENOUS

## 2013-05-13 MED ORDER — OXYCODONE-ACETAMINOPHEN 5-325 MG PO TABS
1.0000 | ORAL_TABLET | ORAL | Status: DC | PRN
  Administered 2013-05-14 – 2013-05-15 (×2): 1 via ORAL
  Filled 2013-05-13 (×2): qty 1

## 2013-05-13 MED ORDER — IBUPROFEN 600 MG PO TABS
600.0000 mg | ORAL_TABLET | Freq: Four times a day (QID) | ORAL | Status: DC
  Administered 2013-05-13 – 2013-05-15 (×8): 600 mg via ORAL
  Filled 2013-05-13 (×8): qty 1

## 2013-05-13 MED ORDER — PHENYLEPHRINE 40 MCG/ML (10ML) SYRINGE FOR IV PUSH (FOR BLOOD PRESSURE SUPPORT)
PREFILLED_SYRINGE | INTRAVENOUS | Status: AC
  Filled 2013-05-13: qty 5

## 2013-05-13 SURGICAL SUPPLY — 31 items
CLAMP CORD UMBIL (MISCELLANEOUS) IMPLANT
CLOTH BEACON ORANGE TIMEOUT ST (SAFETY) ×3 IMPLANT
DERMABOND ADVANCED (GAUZE/BANDAGES/DRESSINGS) ×2
DERMABOND ADVANCED .7 DNX12 (GAUZE/BANDAGES/DRESSINGS) ×1 IMPLANT
DRAPE LG THREE QUARTER DISP (DRAPES) IMPLANT
DRSG OPSITE POSTOP 4X10 (GAUZE/BANDAGES/DRESSINGS) ×3 IMPLANT
DURAPREP 26ML APPLICATOR (WOUND CARE) ×3 IMPLANT
ELECT REM PT RETURN 9FT ADLT (ELECTROSURGICAL) ×3
ELECTRODE REM PT RTRN 9FT ADLT (ELECTROSURGICAL) ×1 IMPLANT
EXTRACTOR VACUUM M CUP 4 TUBE (SUCTIONS) IMPLANT
EXTRACTOR VACUUM M CUP 4' TUBE (SUCTIONS)
GLOVE BIO SURGEON STRL SZ8.5 (GLOVE) ×3 IMPLANT
GOWN STRL REUS W/TWL 2XL LVL3 (GOWN DISPOSABLE) ×3 IMPLANT
GOWN STRL REUS W/TWL LRG LVL3 (GOWN DISPOSABLE) ×3 IMPLANT
KIT ABG SYR 3ML LUER SLIP (SYRINGE) IMPLANT
NEEDLE HYPO 25X5/8 SAFETYGLIDE (NEEDLE) ×3 IMPLANT
NS IRRIG 1000ML POUR BTL (IV SOLUTION) ×3 IMPLANT
PACK C SECTION WH (CUSTOM PROCEDURE TRAY) ×3 IMPLANT
PAD OB MATERNITY 4.3X12.25 (PERSONAL CARE ITEMS) ×3 IMPLANT
SUT CHROMIC 0 CT 802H (SUTURE) ×3 IMPLANT
SUT CHROMIC 1 CTX 36 (SUTURE) ×6 IMPLANT
SUT CHROMIC 2 0 SH (SUTURE) ×3 IMPLANT
SUT GUT PLAIN 0 CT-3 TAN 27 (SUTURE) IMPLANT
SUT MON AB 4-0 PS1 27 (SUTURE) ×3 IMPLANT
SUT VIC AB 0 CT1 18XCR BRD8 (SUTURE) IMPLANT
SUT VIC AB 0 CT1 8-18 (SUTURE)
SUT VIC AB 0 CTX 36 (SUTURE) ×4
SUT VIC AB 0 CTX36XBRD ANBCTRL (SUTURE) ×2 IMPLANT
TOWEL OR 17X24 6PK STRL BLUE (TOWEL DISPOSABLE) ×3 IMPLANT
TRAY FOLEY CATH 14FR (SET/KITS/TRAYS/PACK) ×3 IMPLANT
WATER STERILE IRR 1000ML POUR (IV SOLUTION) ×3 IMPLANT

## 2013-05-13 NOTE — Progress Notes (Signed)
Patient ID: Courtney Johns, female   DOB: 07-07-1980, 33 y.o.   MRN: [id number] Patient has been in adequate labor all night L1 a.m. Cervix was 8-9 molded to a 0 station and her pelvic findings now on change she did delivered by C-section this a.m. Because of failure to progress in labor

## 2013-05-13 NOTE — Addendum Note (Signed)
Addendum created 05/13/13 1255 by Venia Carbon. Royce Macadamia, MD   Modules edited: Anesthesia Review and Sign Navigator Section

## 2013-05-13 NOTE — Plan of Care (Signed)
Problem: Consults Goal: Birthing Suites Patient Information Press F2 to bring up selections list  Outcome: Completed/Met Date Met:  05/13/13  Pt > [redacted] weeks EGA

## 2013-05-13 NOTE — Op Note (Signed)
preop diagnosis previous cesarean section at term failure to progress Postop diagnosis repeat low transverse cesarean section Surgeon Dr. Gracy Racer Anesthesia epidural Procedure patient placed on the operating table in the supine position abdomen prepped and draped bladder emptied with a Foley catheter a transverse suprapubic incision made through the old scar carried him to the rectus fascia fascia cleaned and incised the length of the incision recti muscles retracted laterally peritoneum incised longitudinally transverse incision made on the visceroperitoneum above the bladder and the bladder mobilized inferiorly transverse low uterine incision made patient delivered of a female Apgar 8 and 9 from the OP position the placenta was fundal removed manually and sent to labor and delivery uterine cavity clean with dry laps uterine incision closed in 2 layers with continuous suture #1 chromic hemostasis satisfactory bladder flap reattached to a chromic uterus well contracted tubes and ovaries normal abdomen chosen as peritoneum continuous with 2-0 chromic fascia continuous with of 0 Dexon skin shows a subcuticular stitch of 4-0 Monocryl blood loss 500 cc thank you

## 2013-05-13 NOTE — Transfer of Care (Signed)
Immediate Anesthesia Transfer of Care Note  Patient: Courtney Johns  Procedure(s) Performed: Procedure(s): CESAREAN SECTION (N/A)  Patient Location: PACU  Anesthesia Type:Epidural  Level of Consciousness: awake and alert   Airway & Oxygen Therapy: Patient Spontanous Breathing  Post-op Assessment: Report given to PACU RN and Post -op Vital signs reviewed and stable  Post vital signs: Reviewed and stable  Complications: No apparent anesthesia complications

## 2013-05-13 NOTE — Anesthesia Postprocedure Evaluation (Signed)
  Anesthesia Post-op Note  Patient: Courtney Johns  Procedure(s) Performed: Procedure(s): CESAREAN SECTION (N/A)  Patient Location: PACU  Anesthesia Type:Epidural  Level of Consciousness: awake, alert  and oriented  Airway and Oxygen Therapy: Patient Spontanous Breathing  Post-op Pain: none  Post-op Assessment: Post-op Vital signs reviewed, Patient's Cardiovascular Status Stable, Respiratory Function Stable, Patent Airway, No signs of Nausea or vomiting, Pain level controlled, No headache, No backache, No residual numbness and No residual motor weakness  Post-op Vital Signs: Reviewed and stable  Complications: No apparent anesthesia complications

## 2013-05-14 LAB — CBC
HCT: 22.4 % — ABNORMAL LOW (ref 36.0–46.0)
HEMOGLOBIN: 7.6 g/dL — AB (ref 12.0–15.0)
MCH: 30.4 pg (ref 26.0–34.0)
MCHC: 33.9 g/dL (ref 30.0–36.0)
MCV: 89.6 fL (ref 78.0–100.0)
Platelets: 212 10*3/uL (ref 150–400)
RBC: 2.5 MIL/uL — ABNORMAL LOW (ref 3.87–5.11)
RDW: 13.1 % (ref 11.5–15.5)
WBC: 10.9 10*3/uL — ABNORMAL HIGH (ref 4.0–10.5)

## 2013-05-14 NOTE — Progress Notes (Signed)
Patient ID: Courtney Johns, female   DOB: 04-Nov-1980, 33 y.o.   MRN: [id number] Postop day 2 Vital signs normal Incision clean and dry Legs negative doing well

## 2013-05-14 NOTE — Anesthesia Postprocedure Evaluation (Signed)
Anesthesia Post Note  Patient: Courtney Johns  Procedure(s) Performed: Procedure(s) (LRB): CESAREAN SECTION (N/A)  Anesthesia type: Epidural  Patient location: Mother/Baby  Post pain: Pain level controlled  Post assessment: Post-op Vital signs reviewed  Last Vitals:  Filed Vitals:   05/14/13 0510  BP: 100/69  Pulse: 81  Temp: 36.3 C  Resp: 18    Post vital signs: Reviewed  Level of consciousness:alert  Complications: No apparent anesthesia complications

## 2013-05-14 NOTE — Addendum Note (Signed)
Addendum created 05/14/13 0757 by Asher Muir, CRNA   Modules edited: Notes Section   Notes Section:  File: [id number]

## 2013-05-15 ENCOUNTER — Encounter (HOSPITAL_COMMUNITY): Payer: Self-pay | Admitting: *Deleted

## 2013-05-15 MED ORDER — OXYCODONE-ACETAMINOPHEN 5-325 MG PO TABS: 1.0000 | ORAL_TABLET | ORAL | Status: DC | PRN

## 2013-05-15 MED ORDER — IBUPROFEN 600 MG PO TABS: 600.0000 mg | ORAL_TABLET | Freq: Four times a day (QID) | ORAL | Status: DC | PRN

## 2013-05-15 MED ORDER — FUSION PLUS PO CAPS: 1.0000 | ORAL_CAPSULE | Freq: Every day | ORAL | Status: DC

## 2013-05-15 NOTE — Progress Notes (Signed)
Subjective: Postpartum Day 2: Cesarean Delivery Patient reports tolerating PO, + flatus and no problems voiding.    Objective: Vital signs in last 24 hours: Temp:  [97.7 F (36.5 C)-98.1 F (36.7 C)] 98 F (36.7 C) (03/02 0538) Pulse Rate:  [90-102] 90 (03/02 0538) Resp:  [18] 18 (03/02 0538) BP: (108-114)/(54-73) 114/62 mmHg (03/02 0538) SpO2:  [100 %] 100 % (03/02 0538)  Physical Exam:  General: alert and no distress Lochia: appropriate Uterine Fundus: firm Incision: healing well DVT Evaluation: No evidence of DVT seen on physical exam.   Recent Labs  05/14/13 0600  HGB 7.6*  HCT 22.4*    Assessment/Plan: Status post Cesarean section. Doing well postoperatively.  Anemia.  Clinically stable.  Iron Rx. Discharge home with standard precautions and return to clinic in 2 weeks.  HARPER,CHARLES A 05/15/2013, 8:59 AM

## 2013-05-15 NOTE — Discharge Instructions (Signed)
Cesarean Section (Postpartum Care) °These discharge instructions provide you with general information on cesarean section (caesarean, cesarian, caesarian) and caring for yourself after you leave the hospital. Your caregiver may also give you specific instructions. °Please read these instructions and refer to them in the next few weeks. If you have any questions regarding these instructions, you may call the nursing unit. If you have any problems after discharge, please call your doctor. If you are unable to reach your doctor, you should seek help at the nearest Emergency Department. °ACTIVITY °· Rest as much as possible the first two weeks at home.  °· When possible, have someone help you with your household activities and the new baby for 2 to 3 weeks.  °· Limit your housework and social activity. Increase your activity gradually as your strength returns.  °· Do not climb stairs more than two or three times a day.  °· Do not lift anything heavier than your baby.  °· Follow your doctor's instructions about driving a car.  °· Limit wearing support panties or control-top hose, since relying on your own muscles helps strengthen them.  °· Ask your doctor about exercises.  °NUTRITION °· You may return to your usual diet.  °· Drink 6 to 8 glasses of fluid a day.  °· Eat a well-balanced diet. Include portions of food from the meat/protein, milk, fruit, vegetable and bread groups.  °· Keep taking your prenatal or multivitamins.  °ELIMINATION °You should return to your usual bowel function. If constipation is a problem, you may take a mild laxative such as Milk of MagnesiaÔ with your caregiver’s permission. Gradually add fruit, vegetables and bran to your diet. Make sure to increase your fluids. °HYGIENE °You may shower, wash your hair and take tub baths unless your doctor tells you otherwise. Continue peri-care until your vaginal bleeding and discharge stops. Do not douche or use tampons until your caregiver says it is  OK. °FEVER °If you feel feverish or have shaking chills, take your temperature. If your temperature is 101° F (38.3° C), or is 100.4° F (38° C) two times in a four hour period, call your doctor. The fever may indicate infection. If you call early, infection can be treated with medicines that kill germs (antibiotics). Hospitalization may be avoided. °PAIN CONTROL °You may still have mild discomfort. Only take over-the-counter or prescription medicine as directed by your caregiver. Do not take aspirin; it can cause bleeding. If the pain is not relieved by your medicine or becomes worse, call your caregiver. °INCISION CARE °Clean your cut (incision) gently with soap and water. If your caregiver says it is okay, leave the incision without a dressing unless it is draining or irritated. If you have small adhesive strips across the incision and they do not fall off within 7 days, carefully peel them off. Check the incision daily for increased redness, drainage, swelling or separation of skin. Call your caregiver if any of these happen. °VAGINAL CARE °You may have a vaginal discharge or bleeding for up to 6 weeks. If the vaginal discharge becomes bright red, bad smelling, heavy in amount, has blood clots or if you have burning or frequency when urinating, call your caregiver. °SEXUAL INTERCOURSE °It is best to follow your caregiver's advice about when you may safely resume sexual intercourse. Most women can begin to have intercourse two to three weeks after their baby's birth. °You can become pregnant before you have a period. If you decide to have sexual intercourse, you should use   birth control if you do not want to become pregnant right away. ° °BREAST CARE °If you are not breastfeeding and your breasts become tender, hard or leak milk, you may wear a firm fitting bra and apply ice to the breasts. If you are breastfeeding, wear a good support bra. Call your caregiver if you have breast pain, flu-like symptoms, fever or  hardness and reddening of your breasts. °POSTPARTUM BLUES °After the excitement of having the baby goes away, you may commonly have a period of low spirits or “blues." Discuss your feelings with your partner, family and friends. This may be caused by the changing hormone levels in your body. You may want to contact your caregiver if this is worrisome. °SEEK MEDICAL CARE IF: °· There is swelling, redness or increasing pain in the wound area.  °· Pus is coming from the wound.  °· You notice a bad smell from the wound or surgical dressing.  °· You have pain, redness and swelling from the intravenous site.  °· The wound is breaking open (the edges are not staying together).  °· You feel dizzy or feel like fainting.  °· You develop pain or bleeding when you urinate.  °· You develop diarrhea.  °· You develop nausea and vomiting.  °· You develop abnormal vaginal discharge.  °· You develop a rash.  °· You have any type of abnormal reaction or develop an allergy to your medication.  °· You need stronger pain medication for your pain.  °SEEK IMMEDIATE MEDICAL CARE: °· You develop a temperature of 101 or higher.  °· You develop abdominal pain.  °· You develop chest pain.  °· You develop shortness of breath.  °· You pass out.  °· You develop pain, swelling or redness of your leg.  °· You develop heavy vaginal bleeding with or without blood clots.  °Document Released: 11/22/2001 Document Re-Released: 08/20/2009 °[employer]® Patient Information ©2011 [employer]. °

## 2013-05-15 NOTE — Progress Notes (Signed)
UR chart review completed.  

## 2013-05-15 NOTE — Lactation Note (Signed)
This note was copied from the chart of Courtney Magenta Dement. Lactation Consultation Note  Patient Name: Courtney Johns BOFBP'Z Date: 05/15/2013 Reason for consult: Follow-up assessment (sore nipples , given comfort gels last night ) Per mom "I really want to breast feed ", Just finished giving her baby formula. Mom C/O  Nipple tenderness, LC assessed , no breakdown noted , breast are full bilaterally and noted some areola edema.  LC reviewed basics - breast massage , hand express, pre-pump if needed , and reverse pressure exercise. And breast compressions with latch until the baby is in a good pattern. Reviewed engorgement prevention and tx. Mom aware of the [facility] and the [facility] O/P services.     Maternal Data Has patient been taught Hand Expression?: Yes  Feeding Feeding Type:  (per mom recently gave some formula , ready for D/C ) Length of feed: 15 min  LATCH Score/Interventions                Intervention(s): Breastfeeding basics reviewed (SEE LC note )     Lactation Tools Discussed/Used Tools:  (per mom has a pump at home ) [facility] Program: No   Consult Status Consult Status: Complete    Myer Haff 05/15/2013, 11:32 AM

## 2013-05-15 NOTE — Discharge Summary (Signed)
Obstetric Discharge Summary Reason for Admission: onset of labor Prenatal Procedures: NST and ultrasound Intrapartum Procedures: cesarean: low cervical, transverse Postpartum Procedures: none Complications-Operative and Postpartum: none Hemoglobin  Date Value Ref Range Status  05/14/2013 7.6* 12.0 - 15.0 g/dL Final     HCT  Date Value Ref Range Status  05/14/2013 22.4* 36.0 - 46.0 % Final    Physical Exam:  General: alert and no distress Lochia: appropriate Uterine Fundus: firm Incision: healing well DVT Evaluation: No evidence of DVT seen on physical exam.  Discharge Diagnoses: Term Pregnancy-delivered.  Anemia.  Clinically stable.  Discharge Information: Date: 05/15/2013 Activity: pelvic rest Diet: routine Medications: PNV, Ibuprofen, Colace, Iron and Percocet Condition: stable Instructions: refer to practice specific booklet Discharge to: home Follow-up Information   Follow up with HARPER,CHARLES A, MD In 1 week.   Specialty:  Obstetrics and Gynecology   Contact information:   [address] [place] [address] [phone]       Newborn Data: Live born female  Birth Weight: 6 lb 10.7 oz (3025 g) APGAR: 8, 9  Home with mother.  HARPER,CHARLES A 05/15/2013, 9:08 AM

## 2013-05-16 LAB — TYPE AND SCREEN
ABO/RH(D): O POS
ANTIBODY SCREEN: POSITIVE
DAT, IgG: NEGATIVE
DONOR AG TYPE: NEGATIVE
DONOR AG TYPE: POSITIVE
Donor AG Type: NEGATIVE
Donor AG Type: NEGATIVE
Donor AG Type: NEGATIVE
Donor AG Type: NEGATIVE
Donor AG Type: POSITIVE
PT AG Type: NEGATIVE
UNIT DIVISION: 0
UNIT DIVISION: 0
Unit division: 0
Unit division: 0
Unit division: 0
Unit division: 0
Unit division: 0

## 2013-05-23 ENCOUNTER — Ambulatory Visit (INDEPENDENT_AMBULATORY_CARE_PROVIDER_SITE_OTHER): Payer: Medicaid Other | Admitting: Obstetrics

## 2013-05-23 ENCOUNTER — Encounter: Payer: Self-pay | Admitting: Obstetrics

## 2013-05-23 NOTE — Progress Notes (Signed)
Patient ID: Courtney Johns, female   DOB: 1980-10-02, 33 y.o.   MRN: [id number]  Subjective:     Courtney Johns is a 33 y.o. female who presents for a postpartum visit. She is 10 days postpartum following a low cervical transverse Cesarean section. I have fully reviewed the prenatal and intrapartum course. The delivery was at 66 gestational weeks. Outcome: repeat cesarean section, low transverse incision. Anesthesia: spinal. Postpartum course has been going well. Pt states that incision is healing well. Baby's course has been going well. Baby is feeding by breast. Bleeding thin lochia. Bowel function is normal. Bladder function is normal. Patient is not sexually active. Contraception method is abstinence. Postpartum depression screening: negative.  The following portions of the patient's history were reviewed and updated as appropriate: allergies, current medications, past family history, past medical history, past social history, past surgical history and problem list.  Review of Systems Pertinent items are noted in HPI.   Objective:    There were no vitals taken for this visit.  General:  alert and no distress Breasts:  Negative Abdomen:  Soft, NT.  Incision C, D, I.   Assessment:     Normal postpartum exam. Pap smear not done at today's visit.   Plan:    1. Contraception: abstinence 2. Continue PNV's 3. Follow up in: 4 weeks or as needed.

## 2013-06-20 ENCOUNTER — Encounter: Payer: Self-pay | Admitting: Obstetrics

## 2013-06-20 ENCOUNTER — Ambulatory Visit (INDEPENDENT_AMBULATORY_CARE_PROVIDER_SITE_OTHER): Payer: Medicaid Other | Admitting: Obstetrics

## 2013-06-20 DIAGNOSIS — Z3009 Encounter for other general counseling and advice on contraception: Secondary | ICD-10-CM

## 2013-06-20 MED ORDER — NORETHIN ACE-ETH ESTRAD-FE 1-20 MG-MCG(24) PO TABS: 1.0000 | ORAL_TABLET | Freq: Every day | ORAL | Status: DC

## 2013-06-20 NOTE — Progress Notes (Signed)
Subjective:     Courtney Johns is a 33 y.o. female who presents for a postpartum visit. She is 6 weeks postpartum following a low cervical transverse Cesarean section. I have fully reviewed the prenatal and intrapartum course. The delivery was at 34 gestational weeks. Outcome: repeat cesarean section, low transverse incision. Anesthesia: spinal. Postpartum course has been WNL. Baby's course has been WNL. Baby is feeding by both breast and bottle - Similac Sensitive RS. Bleeding no bleeding. Bowel function is normal. Bladder function is normal. Patient is not sexually active. Contraception method is abstinence. Postpartum depression screening: negative.  The following portions of the patient's history were reviewed and updated as appropriate: allergies, current medications, past family history, past medical history, past social history, past surgical history and problem list.  Review of Systems A comprehensive review of systems was negative.   Objective:    BP 119/81  Pulse 92  Temp(Src) 97.3 F (36.3 C)  Ht 5\' 6"  (1.676 m)  Wt 154 lb (69.854 kg)  BMI 24.87 kg/m2  LMP 05/13/2013  Breastfeeding? Yes  General:  alert and no distress   Breasts:  inspection negative, no nipple discharge or bleeding, no masses or nodularity palpable  Lungs: clear to auscultation bilaterally  Heart:  regular rate and rhythm, S1, S2 normal, no murmur, click, rub or gallop  Abdomen: normal findings: soft, non-tender   Vulva:  normal  Vagina: normal vagina  Cervix:  no lesions  Corpus: normal size, contour, position, consistency, mobility, non-tender  Adnexa:  no mass, fullness, tenderness  Rectal Exam: Not performed.        Assessment:     Normal postpartum exam. Pap smear not done at today's visit.   Plan:    1. Contraception: Contraceptive options discussed. 2. Continue PNV's. 3. Follow up in: 6 weeks or as needed.

## 2013-07-03 ENCOUNTER — Encounter: Payer: Self-pay | Admitting: Obstetrics

## 2013-09-20 ENCOUNTER — Telehealth: Payer: Self-pay | Admitting: *Deleted

## 2013-09-20 NOTE — Telephone Encounter (Signed)
Patient states she was on Loestrin 24 since April 2015 and then stopped taking them last month. Patient states she was having a lot of bleeding. Patient states they are also to expensive. Patient she does not have insurance at this time. Patient is requesting a new prescription. Patient states she is sexually active and has not had intercourse in 1 month.

## 2013-09-25 NOTE — Telephone Encounter (Signed)
OK for refills x 1 year.

## 2013-09-26 ENCOUNTER — Other Ambulatory Visit: Payer: Self-pay | Admitting: *Deleted

## 2013-09-26 DIAGNOSIS — Z30011 Encounter for initial prescription of contraceptive pills: Secondary | ICD-10-CM

## 2013-09-26 MED ORDER — NORGESTIMATE-ETH ESTRADIOL 0.25-35 MG-MCG PO TABS: 1.0000 | ORAL_TABLET | Freq: Every day | ORAL | Status: DC

## 2013-12-20 ENCOUNTER — Ambulatory Visit: Payer: Medicaid Other | Admitting: Obstetrics

## 2014-01-15 ENCOUNTER — Encounter: Payer: Self-pay | Admitting: Obstetrics

## 2014-03-12 ENCOUNTER — Encounter: Payer: Self-pay | Admitting: *Deleted

## 2014-09-29 ENCOUNTER — Other Ambulatory Visit: Payer: Self-pay | Admitting: Obstetrics

## 2015-10-07 ENCOUNTER — Encounter: Payer: Self-pay | Admitting: Obstetrics

## 2015-10-07 ENCOUNTER — Ambulatory Visit (INDEPENDENT_AMBULATORY_CARE_PROVIDER_SITE_OTHER): Payer: Medicaid Other | Admitting: Obstetrics

## 2015-10-07 ENCOUNTER — Other Ambulatory Visit: Payer: Self-pay | Admitting: Obstetrics

## 2015-10-07 VITALS — BP 105/68 | HR 82 | Temp 98.3°F | Wt 161.0 lb

## 2015-10-07 DIAGNOSIS — Z3492 Encounter for supervision of normal pregnancy, unspecified, second trimester: Secondary | ICD-10-CM

## 2015-10-07 DIAGNOSIS — O09522 Supervision of elderly multigravida, second trimester: Secondary | ICD-10-CM

## 2015-10-07 DIAGNOSIS — O09892 Supervision of other high risk pregnancies, second trimester: Secondary | ICD-10-CM

## 2015-10-07 LAB — POCT URINALYSIS DIPSTICK
Bilirubin, UA: NEGATIVE
Glucose, UA: NEGATIVE
Ketones, UA: NEGATIVE
Nitrite, UA: NEGATIVE
PH UA: 5
PROTEIN UA: NEGATIVE
RBC UA: NEGATIVE
SPEC GRAV UA: 1.015
Urobilinogen, UA: NEGATIVE

## 2015-10-07 MED ORDER — VITAFOL-OB PO TABS: 1.0000 | ORAL_TABLET | Freq: Every day | ORAL | 3 refills | Status: DC

## 2015-10-07 NOTE — Progress Notes (Signed)
Patient reports she is doing well today. 

## 2015-10-07 NOTE — Progress Notes (Signed)
  Subjective:    Courtney Johns is a [id number] [redacted]w[redacted]d being seen today for her first obstetrical visit.  Her obstetrical history is significant for advanced maternal age. Patient does intend to breast feed. Pregnancy history fully reviewed.  Patient reports no complaints.  Vitals:   10/07/15 0935  BP: 105/68  Pulse: 82  Temp: 98.3 F (36.8 C)  Weight: 161 lb (73 kg)    HISTORY: OB History  Gravida Para Term Preterm AB Living  3 2 2  0 0 2  SAB TAB Ectopic Multiple Live Births  0 0 0 0 2    # Outcome Date GA Lbr Len/2nd Weight Sex Delivery Anes PTL Lv  3 Current           2 Term 05/13/13 [redacted]w[redacted]d  6 lb 10.7 oz (3.025 kg) M CS-LTranv EPI  LIV  1 Term 12/24/11 [redacted]w[redacted]d  6 lb 1.5 oz (2.765 kg) M CS-LTranv EPI  LIV     Past Medical History:  Diagnosis Date  . Fibroid    4CM anterior  . GERD (gastroesophageal reflux disease)   . Headache(784.0)   . Infection    urinary tract infection  . Malaria    Past Surgical History:  Procedure Laterality Date  . BREAST SURGERY     R Breast  . CESAREAN SECTION  12/24/2011   Procedure: CESAREAN SECTION;  Surgeon: Shelly Bombard, MD;  Location: [facility];  Service: Obstetrics;  Laterality: N/A;  . CESAREAN SECTION N/A 05/13/2013   Procedure: CESAREAN SECTION;  Surgeon: Frederico Hamman, MD;  Location: [facility];  Service: Obstetrics;  Laterality: N/A;   Family History  Problem Relation Age of Onset  . Hypertension Mother   . Anesthesia problems Neg Hx   . Other Neg Hx      Exam    Uterus:     Pelvic Exam:    Perineum: No Hemorrhoids, Normal Perineum   Vulva: normal, Bartholin's, Urethra, Skene's normal   Vagina:  normal mucosa, normal discharge   pH:    Cervix: no cervical motion tenderness   Adnexa: no mass, fullness, tenderness   Bony Pelvis: gynecoid  System: Breast:  normal appearance, no masses or tenderness   Skin: normal coloration and turgor, no rashes    Neurologic: oriented, normal, normal mood, gait normal; reflexes  normal and symmetric, CN II-XII intact, motor: normal    Extremities: normal strength, tone, and muscle mass   HEENT PERRLA   Mouth/Teeth mucous membranes moist, pharynx normal without lesions and dental hygiene good   Neck supple   Cardiovascular: regular rate and rhythm   Respiratory:  appears well, vitals normal, no respiratory distress, acyanotic, normal RR, ear and throat exam is normal, neck free of mass or lymphadenopathy, chest clear, no wheezing, crepitations, rhonchi, normal symmetric air entry   Abdomen: soft, non-tender; bowel sounds normal; no masses,  no organomegaly   Urinary: urethral meatus normal      Assessment:    Pregnancy: [id number] Patient Active Problem List   Diagnosis Date Noted  . S/P cesarean section 05/13/2013  . Active labor 05/12/2013        Plan:     Initial labs drawn. Prenatal vitamins. Problem list reviewed and updated. Genetic Screening discussed Integrated Screen: declined.  Ultrasound discussed; fetal survey: requested.  Follow up in 2 weeks. coordination of care.     HARPER,CHARLES A 10/07/2015 Patient ID: Courtney Johns, female   DOB: Mar 20, 1980, 35 y.o.   MRN: [id number]

## 2015-10-08 ENCOUNTER — Encounter (HOSPITAL_COMMUNITY): Payer: Self-pay | Admitting: Obstetrics

## 2015-10-09 LAB — PRENATAL PROFILE I(LABCORP)
BASOS ABS: 0 10*3/uL (ref 0.0–0.2)
Basos: 0 %
EOS (ABSOLUTE): 0.1 10*3/uL (ref 0.0–0.4)
EOS: 2 %
Hematocrit: 35 % (ref 34.0–46.6)
Hemoglobin: 11.8 g/dL (ref 11.1–15.9)
Hepatitis B Surface Ag: NEGATIVE
Immature Grans (Abs): 0 10*3/uL (ref 0.0–0.1)
Immature Granulocytes: 1 %
LYMPHS ABS: 1.4 10*3/uL (ref 0.7–3.1)
Lymphs: 18 %
MCH: 31.4 pg (ref 26.6–33.0)
MCHC: 33.7 g/dL (ref 31.5–35.7)
MCV: 93 fL (ref 79–97)
Monocytes Absolute: 0.4 10*3/uL (ref 0.1–0.9)
Monocytes: 5 %
NEUTROS ABS: 6 10*3/uL (ref 1.4–7.0)
Neutrophils: 74 %
PLATELETS: 291 10*3/uL (ref 150–379)
RBC: 3.76 x10E6/uL — AB (ref 3.77–5.28)
RDW: 14.6 % (ref 12.3–15.4)
RH TYPE: POSITIVE
RPR: NONREACTIVE
Rubella Antibodies, IGG: 26.7 index (ref >0.99)
WBC: 8 10*3/uL (ref 3.4–10.8)

## 2015-10-09 LAB — PAP IG AND HPV HIGH-RISK
HPV, HIGH-RISK: NEGATIVE
PAP Smear Comment: 0

## 2015-10-09 LAB — AB SCR+ANTIBODY ID: ANTIBODY SCREEN: POSITIVE — AB

## 2015-10-09 LAB — HEMOGLOBINOPATHY EVALUATION
HEMOGLOBIN A2 QUANTITATION: 3.1 % (ref 0.7–3.1)
HEMOGLOBIN F QUANTITATION: 0 % (ref 0.0–2.0)
HGB A: 96.9 % (ref 94.0–98.0)
HGB C: 0 %
HGB S: 0 %

## 2015-10-09 LAB — HIV ANTIBODY (ROUTINE TESTING W REFLEX): HIV SCREEN 4TH GENERATION: NONREACTIVE

## 2015-10-09 LAB — VARICELLA ZOSTER ANTIBODY, IGG: Varicella zoster IgG: <135 index — ABNORMAL LOW (ref >165)

## 2015-10-09 LAB — VITAMIN D 25 HYDROXY (VIT D DEFICIENCY, FRACTURES): VIT D 25 HYDROXY: 18.6 ng/mL — AB (ref 30.0–100.0)

## 2015-10-10 ENCOUNTER — Other Ambulatory Visit: Payer: Self-pay | Admitting: Obstetrics

## 2015-10-10 DIAGNOSIS — O09522 Supervision of elderly multigravida, second trimester: Secondary | ICD-10-CM

## 2015-10-10 DIAGNOSIS — O361921 Maternal care for other isoimmunization, second trimester, fetus 1: Secondary | ICD-10-CM

## 2015-10-11 ENCOUNTER — Other Ambulatory Visit: Payer: Self-pay | Admitting: Obstetrics

## 2015-10-11 DIAGNOSIS — B373 Candidiasis of vulva and vagina: Secondary | ICD-10-CM

## 2015-10-11 DIAGNOSIS — B3731 Acute candidiasis of vulva and vagina: Secondary | ICD-10-CM

## 2015-10-11 LAB — URINE CULTURE, OB REFLEX

## 2015-10-11 LAB — NUSWAB VG+, CANDIDA 6SP
CANDIDA ALBICANS, NAA: POSITIVE — AB
CANDIDA LUSITANIAE, NAA: NEGATIVE
CANDIDA PARAPSILOSIS, NAA: NEGATIVE
CANDIDA TROPICALIS, NAA: NEGATIVE
Candida glabrata, NAA: NEGATIVE
Candida krusei, NAA: NEGATIVE
Chlamydia trachomatis, NAA: NEGATIVE
Neisseria gonorrhoeae, NAA: NEGATIVE
Trich vag by NAA: NEGATIVE

## 2015-10-11 LAB — CULTURE, OB URINE

## 2015-10-11 MED ORDER — TERCONAZOLE 0.8 % VA CREA: 1.0000 | TOPICAL_CREAM | Freq: Every day | VAGINAL | 0 refills | Status: DC

## 2015-10-11 MED ORDER — AMOXICILLIN-POT CLAVULANATE 875-125 MG PO TABS: 1.0000 | ORAL_TABLET | Freq: Two times a day (BID) | ORAL | 0 refills | Status: DC

## 2015-10-15 ENCOUNTER — Encounter: Payer: Self-pay | Admitting: Obstetrics

## 2015-10-15 ENCOUNTER — Ambulatory Visit (HOSPITAL_COMMUNITY)
Admission: RE | Admit: 2015-10-15 | Discharge: 2015-10-15 | Disposition: A | Discharge status: Home / Self Care | Payer: Medicaid Other | Source: Ambulatory Visit | Attending: Obstetrics | Admitting: Obstetrics

## 2015-10-15 ENCOUNTER — Ambulatory Visit (INDEPENDENT_AMBULATORY_CARE_PROVIDER_SITE_OTHER): Payer: Medicaid Other | Admitting: Obstetrics

## 2015-10-15 ENCOUNTER — Other Ambulatory Visit: Payer: Self-pay | Admitting: Obstetrics

## 2015-10-15 ENCOUNTER — Encounter (HOSPITAL_COMMUNITY): Payer: Self-pay

## 2015-10-15 DIAGNOSIS — O09522 Supervision of elderly multigravida, second trimester: Secondary | ICD-10-CM | POA: Diagnosis not present

## 2015-10-15 DIAGNOSIS — O3412 Maternal care for benign tumor of corpus uteri, second trimester: Secondary | ICD-10-CM

## 2015-10-15 DIAGNOSIS — Z3A23 23 weeks gestation of pregnancy: Secondary | ICD-10-CM

## 2015-10-15 DIAGNOSIS — Z3492 Encounter for supervision of normal pregnancy, unspecified, second trimester: Secondary | ICD-10-CM

## 2015-10-15 DIAGNOSIS — Z315 Encounter for genetic counseling: Secondary | ICD-10-CM | POA: Insufficient documentation

## 2015-10-15 DIAGNOSIS — O09529 Supervision of elderly multigravida, unspecified trimester: Secondary | ICD-10-CM | POA: Insufficient documentation

## 2015-10-15 DIAGNOSIS — Z3689 Encounter for other specified antenatal screening: Secondary | ICD-10-CM

## 2015-10-15 DIAGNOSIS — D259 Leiomyoma of uterus, unspecified: Secondary | ICD-10-CM

## 2015-10-15 DIAGNOSIS — O0932 Supervision of pregnancy with insufficient antenatal care, second trimester: Secondary | ICD-10-CM

## 2015-10-15 DIAGNOSIS — O09892 Supervision of other high risk pregnancies, second trimester: Secondary | ICD-10-CM

## 2015-10-15 DIAGNOSIS — O34219 Maternal care for unspecified type scar from previous cesarean delivery: Secondary | ICD-10-CM

## 2015-10-15 DIAGNOSIS — O361921 Maternal care for other isoimmunization, second trimester, fetus 1: Secondary | ICD-10-CM

## 2015-10-15 LAB — POCT URINALYSIS DIPSTICK
BILIRUBIN UA: NEGATIVE
Blood, UA: NEGATIVE
Glucose, UA: NEGATIVE
KETONES UA: NEGATIVE
Nitrite, UA: NEGATIVE
PH UA: 7
PROTEIN UA: NEGATIVE
SPEC GRAV UA: 1.01
Urobilinogen, UA: 0.2

## 2015-10-15 NOTE — Progress Notes (Signed)
Subjective:    Courtney Johns is a 35 y.o. female being seen today for her obstetrical visit. She is at [redacted]w[redacted]d gestation. Patient reports: no complaints . Fetal movement: normal.  Problem List Items Addressed This Visit    None    Visit Diagnoses   None.    Patient Active Problem List   Diagnosis Date Noted  . Advanced maternal age in multigravida 10/15/2015  . S/P cesarean section 05/13/2013  . Active labor 05/12/2013   Objective:    LMP 05/01/2015 (Approximate)  FHT: 150 BPM  Uterine Size: size equals dates     Assessment:    Pregnancy @ [redacted]w[redacted]d    Plan:    OBGCT: ordered for next visit. Signs and symptoms of preterm labor: discussed. VBAC: discussed and planned.  Labs, problem list reviewed and updated 2 hr GTT planned Follow up in 4 weeks.

## 2015-10-15 NOTE — Progress Notes (Signed)
Genetic Counseling  High-Risk Gestation Note  Appointment Date:  10/15/2015 Referred By: Shelly Bombard, MD Date of Birth:  1980/05/25   Pregnancy History: [id number] Estimated Date of Delivery: 02/05/16 Estimated Gestational Age: [redacted]w[redacted]d Attending: Benjaman Lobe, MD   Ms. Amere S Mleczko was seen for genetic counseling because of a maternal age of 35 y.o..     In summary:  Discussed AMA and associated risk for fetal aneuploidy  Discussed options for screening  NIPS- declined  Ultrasound- performed today; complete results reported separately  Discussed diagnostic testing options  Amniocentesis- declined  Reviewed family history concerns  Discussed carrier screening options  CF-declined  SMA-declined  Hemoglobinopathies-within normal range through OB  She was counseled regarding maternal age and the association with risk for chromosome conditions due to nondisjunction with aging of the ova.  We reviewed chromosomes, nondisjunction, and the associated 1 in 111 risk for fetal aneuploidy related to a maternal age of 35 y.o. at [redacted]w[redacted]d gestation.  She was counseled that the risk for aneuploidy decreases as gestational age increases, accounting for those pregnancies which spontaneously abort.  We specifically discussed Down syndrome (trisomy 78), trisomies 62 and 68, and sex chromosome aneuploidies (47,XXX and 47,XXY) including the common features and prognoses of each.   We reviewed available screening options including First Screen, Quad screen, noninvasive prenatal screening (NIPS)/cell free DNA (cfDNA) screening, and detailed ultrasound.  She was counseled that screening tests are used to modify a patient's a priori risk for aneuploidy, typically based on age. This estimate provides a pregnancy specific risk assessment. We reviewed the benefits and limitations of each option. Specifically, we discussed the conditions for which each test screens, the detection rates, and false  positive rates of each. She was also counseled regarding diagnostic testing via amniocentesis. We reviewed the approximate 1 in [id number] risk for complications from amniocentesis, including spontaneous preterm labor and delivery. We discussed the possible results that the tests might provide including: positive, negative, unanticipated, and no result. Finally, they were counseled regarding the cost of each option and potential out of pocket expenses. After consideration of all the options, she declined NIPS and amniocentesis.    A complete ultrasound was performed today. The ultrasound report will be sent under separate cover. There were no visualized fetal anomalies or markers suggestive of aneuploidy. Fetal heart was suboptimally visualized today. Follow-up ultrasound is planned. She understands that screening tests cannot rule out all birth defects or genetic syndromes. The patient was advised of this limitation and states she still does not want additional testing at this time.   Ms. RITAANN LEPPO was provided with written information regarding cystic fibrosis (CF), spinal muscular atrophy (SMA) and hemoglobinopathies including the carrier frequency, availability of carrier screening and prenatal diagnosis if indicated.  In addition, we discussed that CF and hemoglobinopathies are routinely screened for as part of the [place] newborn screening panel.  After further discussion, she declined screening for CF and SMA.  Hemoglobin electrophoresis was previously performed and was within normal range for the patient. She reported that her husband and one of their sons has sickle cell trait (Hb AS). We reviewed the autosomal recessive inheritance of sickle cell anemia. Given the patient's previous screening, the pregnancy would not be expected to be at increased risk for sickle cell anemia, but would have a 1 in 2 (50%) chance to inherit sickle cell trait.   Both family histories were reviewed and found to be  contributory for a 55 month old niece of  the patient with Down syndrome. We discussed that 95% of cases of Down syndrome are not inherited and are the result of non-disjunction.  Three to 4% of cases of Down syndrome are the result of a translocation involving chromosome #21.  We discussed the option of chromosome analysis to determine if an individual is a carrier of a balanced translocation involving chromosome #21.  If an individual carries a balanced translocation involving chromosome #21, then the chance to have a baby with Down syndrome would be greater than the maternal age-related risk.  The reported family history is most suggestive of sporadic Down syndrome. Without further information regarding the provided family history, an accurate genetic risk cannot be calculated. Further genetic counseling is warranted if more information is obtained.  Ms. KAYIN DOERFLER denied exposure to environmental toxins or chemical agents. She denied the use of alcohol, tobacco or street drugs. She denied significant viral illnesses during the course of her pregnancy. Her medical and surgical histories were noncontributory.   I counseled Ms. Sylvia S Dern  regarding the above risks and available options.  The approximate face-to-face time with the genetic counselor was 30 minutes.  Chipper Oman, MS,  Certified Genetic Counselor 10/15/2015

## 2015-10-31 ENCOUNTER — Telehealth: Payer: Self-pay | Admitting: *Deleted

## 2015-10-31 NOTE — Telephone Encounter (Signed)
Patient aware of lab result and has medication.Instructed to drink at least 6-8 8 oz glasses of water a day.Made aware of Varicella status and need for booster postpartum.

## 2015-11-12 ENCOUNTER — Ambulatory Visit (HOSPITAL_COMMUNITY)
Admission: RE | Admit: 2015-11-12 | Discharge: 2015-11-12 | Disposition: A | Discharge status: Home / Self Care | Payer: Medicaid Other | Source: Ambulatory Visit | Attending: Obstetrics | Admitting: Obstetrics

## 2015-11-12 ENCOUNTER — Encounter (HOSPITAL_COMMUNITY): Payer: Self-pay

## 2015-11-12 DIAGNOSIS — O0932 Supervision of pregnancy with insufficient antenatal care, second trimester: Secondary | ICD-10-CM | POA: Insufficient documentation

## 2015-11-12 DIAGNOSIS — Z3A27 27 weeks gestation of pregnancy: Secondary | ICD-10-CM | POA: Insufficient documentation

## 2015-11-12 DIAGNOSIS — D259 Leiomyoma of uterus, unspecified: Secondary | ICD-10-CM | POA: Diagnosis not present

## 2015-11-12 DIAGNOSIS — O34219 Maternal care for unspecified type scar from previous cesarean delivery: Secondary | ICD-10-CM | POA: Diagnosis not present

## 2015-11-12 DIAGNOSIS — O09529 Supervision of elderly multigravida, unspecified trimester: Secondary | ICD-10-CM

## 2015-11-12 DIAGNOSIS — O3412 Maternal care for benign tumor of corpus uteri, second trimester: Secondary | ICD-10-CM | POA: Insufficient documentation

## 2015-11-12 DIAGNOSIS — O09522 Supervision of elderly multigravida, second trimester: Secondary | ICD-10-CM

## 2015-11-14 ENCOUNTER — Encounter: Payer: Self-pay | Admitting: Obstetrics

## 2015-11-14 ENCOUNTER — Other Ambulatory Visit: Payer: Medicaid Other

## 2015-11-14 ENCOUNTER — Ambulatory Visit (INDEPENDENT_AMBULATORY_CARE_PROVIDER_SITE_OTHER): Payer: Medicaid Other | Admitting: Obstetrics

## 2015-11-14 VITALS — BP 98/62 | HR 87 | Temp 98.2°F | Wt 161.0 lb

## 2015-11-14 DIAGNOSIS — Z3490 Encounter for supervision of normal pregnancy, unspecified, unspecified trimester: Secondary | ICD-10-CM

## 2015-11-14 DIAGNOSIS — Z3493 Encounter for supervision of normal pregnancy, unspecified, third trimester: Secondary | ICD-10-CM

## 2015-11-14 LAB — POCT URINALYSIS DIPSTICK
Bilirubin, UA: NEGATIVE
Blood, UA: NEGATIVE
Glucose, UA: 50
KETONES UA: NEGATIVE
LEUKOCYTES UA: NEGATIVE
NITRITE UA: NEGATIVE
PH UA: 5
PROTEIN UA: NEGATIVE
Spec Grav, UA: 1.025
UROBILINOGEN UA: 0.2

## 2015-11-14 NOTE — Progress Notes (Signed)
Subjective:    Courtney Johns is a 35 y.o. female being seen today for her obstetrical visit. She is at [redacted]w[redacted]d gestation. Patient reports no complaints. Fetal movement: normal.  Problem List Items Addressed This Visit    None    Visit Diagnoses    Prenatal care, third trimester    -  Primary   Relevant Orders   POCT Urinalysis Dipstick (Completed)     Patient Active Problem List   Diagnosis Date Noted  . Advanced maternal age in multigravida 10/15/2015  . S/P cesarean section 05/13/2013  . Active labor 05/12/2013   Objective:    BP 98/62   Pulse 87   Temp 98.2 F (36.8 C)   Wt 161 lb (73 kg)   LMP 05/01/2015 (Approximate)   BMI 25.99 kg/m  FHT:  150 BPM  Uterine Size: size equals dates  Presentation: unsure     Assessment:    Pregnancy @ [redacted]w[redacted]d weeks   Plan:     labs reviewed, problem list updated Consent signed. GBS sent TDAP offered  Rhogam given for RH negative Pediatrician: discussed. Infant feeding: plans to breastfeed. Maternity leave: discussed. Cigarette smoking: never smoked. Orders Placed This Encounter  Procedures  . POCT Urinalysis Dipstick   No orders of the defined types were placed in this encounter.  Follow up in 2 Weeks.

## 2015-11-14 NOTE — Progress Notes (Signed)
Pt denies concerns at this time. 

## 2015-11-15 LAB — CBC
HEMATOCRIT: 31.9 % — AB (ref 34.0–46.6)
Hemoglobin: 10.4 g/dL — ABNORMAL LOW (ref 11.1–15.9)
MCH: 31.5 pg (ref 26.6–33.0)
MCHC: 32.6 g/dL (ref 31.5–35.7)
MCV: 97 fL (ref 79–97)
PLATELETS: 270 10*3/uL (ref 150–379)
RBC: 3.3 x10E6/uL — ABNORMAL LOW (ref 3.77–5.28)
RDW: 14 % (ref 12.3–15.4)
WBC: 6.9 10*3/uL (ref 3.4–10.8)

## 2015-11-15 LAB — GLUCOSE TOLERANCE, 2 HOURS W/ 1HR
GLUCOSE, 2 HOUR: 89 mg/dL (ref 65–152)
GLUCOSE, FASTING: 78 mg/dL (ref 65–91)
Glucose, 1 hour: 112 mg/dL (ref 65–179)

## 2015-11-15 LAB — HIV ANTIBODY (ROUTINE TESTING W REFLEX): HIV Screen 4th Generation wRfx: NONREACTIVE

## 2015-11-15 LAB — RPR: RPR: NONREACTIVE

## 2015-12-01 DIAGNOSIS — O0990 Supervision of high risk pregnancy, unspecified, unspecified trimester: Secondary | ICD-10-CM | POA: Insufficient documentation

## 2015-12-03 ENCOUNTER — Encounter: Payer: Self-pay | Admitting: Obstetrics

## 2015-12-10 ENCOUNTER — Ambulatory Visit (INDEPENDENT_AMBULATORY_CARE_PROVIDER_SITE_OTHER): Payer: Medicaid Other | Admitting: Obstetrics and Gynecology

## 2015-12-10 DIAGNOSIS — O09893 Supervision of other high risk pregnancies, third trimester: Secondary | ICD-10-CM | POA: Diagnosis not present

## 2015-12-10 DIAGNOSIS — Z23 Encounter for immunization: Secondary | ICD-10-CM

## 2015-12-10 DIAGNOSIS — O34219 Maternal care for unspecified type scar from previous cesarean delivery: Secondary | ICD-10-CM | POA: Insufficient documentation

## 2015-12-10 DIAGNOSIS — O0993 Supervision of high risk pregnancy, unspecified, third trimester: Secondary | ICD-10-CM

## 2015-12-10 DIAGNOSIS — O09523 Supervision of elderly multigravida, third trimester: Secondary | ICD-10-CM | POA: Diagnosis not present

## 2015-12-10 DIAGNOSIS — Z283 Underimmunization status: Secondary | ICD-10-CM

## 2015-12-10 DIAGNOSIS — Z2839 Other underimmunization status: Secondary | ICD-10-CM

## 2015-12-10 MED ORDER — TETANUS-DIPHTH-ACELL PERTUSSIS 5-2.5-18.5 LF-MCG/0.5 IM SUSP
0.5000 mL | Freq: Once | INTRAMUSCULAR | Status: AC
  Administered 2015-12-10: 0.5 mL via INTRAMUSCULAR

## 2015-12-10 NOTE — Addendum Note (Signed)
Addended by: Manuela Schwartz C on: 12/10/2015 04:50 PM   Modules accepted: Orders

## 2015-12-10 NOTE — Progress Notes (Signed)
   PRENATAL VISIT NOTE  Subjective:  Courtney Johns is a 35 y.o. G3P2002 at [redacted]w[redacted]d being seen today for ongoing prenatal care.  She is currently monitored for the following issues for this low-risk pregnancy and has Advanced maternal age in multigravida; Supervision of high risk pregnancy, antepartum; Previous cesarean section complicating pregnancy, antepartum condition or complication; and Susceptible to Varicella (non-immune), currently pregnant in third trimester on her problem list.  Patient reports no complaints.  Contractions: Not present. Vag. Bleeding: None.  Movement: Present. Denies leaking of fluid.   The following portions of the patient's history were reviewed and updated as appropriate: allergies, current medications, past family history, past medical history, past social history, past surgical history and problem list. Problem list updated.  Objective:   Vitals:   12/10/15 1342  BP: 104/72  Pulse: 94  Temp: 98.7 F (37.1 C)  Weight: 160 lb 12.8 oz (72.9 kg)    Fetal Status: Fetal Heart Rate (bpm): 147 Fundal Height: 32 cm Movement: Present     General:  Alert, oriented and cooperative. Patient is in no acute distress.  Skin: Skin is warm and dry. No rash noted.   Cardiovascular: Normal heart rate noted  Respiratory: Normal respiratory effort, no problems with respiration noted  Abdomen: Soft, gravid, appropriate for gestational age. Pain/Pressure: Absent     Pelvic:  Cervical exam deferred        Extremities: Normal range of motion.  Edema: None  Mental Status: Normal mood and affect. Normal behavior. Normal judgment and thought content.   Urinalysis:      Assessment and Plan:  Pregnancy: G3P2002 at [redacted]w[redacted]d  1. Supervision of high risk pregnancy, antepartum, third trimester Patient is doing well without complaints Results of glucola test reviewed Patient agreed to receive flu and TDap vaccine today  2. Previous cesarean section complicating pregnancy,  antepartum condition or complication Will be scheduled for repeat at 39 weeks Patient is interested in LARC  3. Advanced maternal age in multigravida, third trimester Declined genetic testing  4. Susceptible to Varicella (non-immune), currently pregnant in third trimester Will receive postpartum  Preterm labor symptoms and general obstetric precautions including but not limited to vaginal bleeding, contractions, leaking of fluid and fetal movement were reviewed in detail with the patient. Please refer to After Visit Summary for other counseling recommendations.  Return in about 2 weeks (around 12/24/2015).  Mora Bellman, MD

## 2015-12-10 NOTE — Patient Instructions (Signed)
Contraception Choices Contraception (birth control) is the use of any methods or devices to prevent pregnancy. Below are some methods to help avoid pregnancy. HORMONAL METHODS   Contraceptive implant. This is a thin, plastic tube containing progesterone hormone. It does not contain estrogen hormone. Your health care provider inserts the tube in the inner part of the upper arm. The tube can remain in place for up to 3 years. After 3 years, the implant must be removed. The implant prevents the ovaries from releasing an egg (ovulation), thickens the cervical mucus to prevent sperm from entering the uterus, and thins the lining of the inside of the uterus.  Progesterone-only injections. These injections are given every 3 months by your health care provider to prevent pregnancy. This synthetic progesterone hormone stops the ovaries from releasing eggs. It also thickens cervical mucus and changes the uterine lining. This makes it harder for sperm to survive in the uterus.  Birth control pills. These pills contain estrogen and progesterone hormone. They work by preventing the ovaries from releasing eggs (ovulation). They also cause the cervical mucus to thicken, preventing the sperm from entering the uterus. Birth control pills are prescribed by a health care provider.Birth control pills can also be used to treat heavy periods.  Minipill. This type of birth control pill contains only the progesterone hormone. They are taken every day of each month and must be prescribed by your health care provider.  Birth control patch. The patch contains hormones similar to those in birth control pills. It must be changed once a week and is prescribed by a health care provider.  Vaginal ring. The ring contains hormones similar to those in birth control pills. It is left in the vagina for 3 weeks, removed for 1 week, and then a new one is put back in place. The patient must be comfortable inserting and removing the ring  from the vagina.A health care provider's prescription is necessary.  Emergency contraception. Emergency contraceptives prevent pregnancy after unprotected sexual intercourse. This pill can be taken right after sex or up to 5 days after unprotected sex. It is most effective the sooner you take the pills after having sexual intercourse. Most emergency contraceptive pills are available without a prescription. Check with your pharmacist. Do not use emergency contraception as your only form of birth control. BARRIER METHODS   Female condom. This is a thin sheath (latex or rubber) that is worn over the penis during sexual intercourse. It can be used with spermicide to increase effectiveness.  Female condom. This is a soft, loose-fitting sheath that is put into the vagina before sexual intercourse.  Diaphragm. This is a soft, latex, dome-shaped barrier that must be fitted by a health care provider. It is inserted into the vagina, along with a spermicidal jelly. It is inserted before intercourse. The diaphragm should be left in the vagina for 6 to 8 hours after intercourse.  Cervical cap. This is a round, soft, latex or plastic cup that fits over the cervix and must be fitted by a health care provider. The cap can be left in place for up to 48 hours after intercourse.  Sponge. This is a soft, circular piece of polyurethane foam. The sponge has spermicide in it. It is inserted into the vagina after wetting it and before sexual intercourse.  Spermicides. These are chemicals that kill or block sperm from entering the cervix and uterus. They come in the form of creams, jellies, suppositories, foam, or tablets. They do not require a   prescription. They are inserted into the vagina with an applicator before having sexual intercourse. The process must be repeated every time you have sexual intercourse. INTRAUTERINE CONTRACEPTION  Intrauterine device (IUD). This is a T-shaped device that is put in a woman's uterus  during a menstrual period to prevent pregnancy. There are 2 types:  Copper IUD. This type of IUD is wrapped in copper wire and is placed inside the uterus. Copper makes the uterus and fallopian tubes produce a fluid that kills sperm. It can stay in place for 10 years.  Hormone IUD. This type of IUD contains the hormone progestin (synthetic progesterone). The hormone thickens the cervical mucus and prevents sperm from entering the uterus, and it also thins the uterine lining to prevent implantation of a fertilized egg. The hormone can weaken or kill the sperm that get into the uterus. It can stay in place for 3-5 years, depending on which type of IUD is used. PERMANENT METHODS OF CONTRACEPTION  Female tubal ligation. This is when the woman's fallopian tubes are surgically sealed, tied, or blocked to prevent the egg from traveling to the uterus.  Hysteroscopic sterilization. This involves placing a small coil or insert into each fallopian tube. Your doctor uses a technique called hysteroscopy to do the procedure. The device causes scar tissue to form. This results in permanent blockage of the fallopian tubes, so the sperm cannot fertilize the egg. It takes about 3 months after the procedure for the tubes to become blocked. You must use another form of birth control for these 3 months.  Female sterilization. This is when the female has the tubes that carry sperm tied off (vasectomy).This blocks sperm from entering the vagina during sexual intercourse. After the procedure, the man can still ejaculate fluid (semen). NATURAL PLANNING METHODS  Natural family planning. This is not having sexual intercourse or using a barrier method (condom, diaphragm, cervical cap) on days the woman could become pregnant.  Calendar method. This is keeping track of the length of each menstrual cycle and identifying when you are fertile.  Ovulation method. This is avoiding sexual intercourse during ovulation.  Symptothermal  method. This is avoiding sexual intercourse during ovulation, using a thermometer and ovulation symptoms.  Post-ovulation method. This is timing sexual intercourse after you have ovulated. Regardless of which type or method of contraception you choose, it is important that you use condoms to protect against the transmission of sexually transmitted infections (STIs). Talk with your health care provider about which form of contraception is most appropriate for you.   This information is not intended to replace advice given to you by your health care provider. Make sure you discuss any questions you have with your health care provider.   Document Released: 03/02/2005 Document Revised: 03/07/2013 Document Reviewed: 08/25/2012 Elsevier Interactive Patient Education 2016 [employer]vier Inc[employer]  

## 2015-12-13 ENCOUNTER — Encounter (HOSPITAL_COMMUNITY): Payer: Self-pay | Admitting: *Deleted

## 2015-12-26 ENCOUNTER — Ambulatory Visit (INDEPENDENT_AMBULATORY_CARE_PROVIDER_SITE_OTHER): Payer: Medicaid Other | Admitting: Obstetrics and Gynecology

## 2015-12-26 VITALS — BP 108/71 | HR 89 | Temp 98.4°F | Wt 159.4 lb

## 2015-12-26 DIAGNOSIS — Z283 Underimmunization status: Secondary | ICD-10-CM | POA: Diagnosis not present

## 2015-12-26 DIAGNOSIS — O09523 Supervision of elderly multigravida, third trimester: Secondary | ICD-10-CM

## 2015-12-26 DIAGNOSIS — O34219 Maternal care for unspecified type scar from previous cesarean delivery: Secondary | ICD-10-CM | POA: Diagnosis not present

## 2015-12-26 DIAGNOSIS — O0990 Supervision of high risk pregnancy, unspecified, unspecified trimester: Secondary | ICD-10-CM

## 2015-12-26 DIAGNOSIS — O09893 Supervision of other high risk pregnancies, third trimester: Secondary | ICD-10-CM

## 2015-12-26 MED ORDER — FLUCONAZOLE 150 MG PO TABS: 150.0000 mg | ORAL_TABLET | Freq: Once | ORAL | 0 refills | Status: AC

## 2015-12-26 NOTE — Progress Notes (Signed)
Patient is in office and states that she occasionally feels pressure and irregular contractions, no bleeding, reports good fetal movement.

## 2015-12-26 NOTE — Progress Notes (Signed)
   PRENATAL VISIT NOTE  Subjective:  Courtney Johns is a 35 y.o. G3P2002 at [redacted]w[redacted]d being seen today for ongoing prenatal care.  She is currently monitored for the following issues for this low-risk pregnancy and has Advanced maternal age in multigravida; Supervision of high risk pregnancy, antepartum; Previous cesarean section complicating pregnancy, antepartum condition or complication; and Susceptible to Varicella (non-immune), currently pregnant in third trimester on her problem list.  Patient reports vaginal irritation. She feels it is a yeast infection and desires treatment.  Contractions: Irregular. Vag. Bleeding: None.  Movement: Present. Denies leaking of fluid.   The following portions of the patient's history were reviewed and updated as appropriate: allergies, current medications, past family history, past medical history, past social history, past surgical history and problem list. Problem list updated.  Objective:   Vitals:   12/26/15 1133  BP: 108/71  Pulse: 89  Temp: 98.4 F (36.9 C)  Weight: 159 lb 6.4 oz (72.3 kg)    Fetal Status: Fetal Heart Rate (bpm): 145 Fundal Height: 34 cm Movement: Present     General:  Alert, oriented and cooperative. Patient is in no acute distress.  Skin: Skin is warm and dry. No rash noted.   Cardiovascular: Normal heart rate noted  Respiratory: Normal respiratory effort, no problems with respiration noted  Abdomen: Soft, gravid, appropriate for gestational age. Pain/Pressure: Present     Pelvic:  Cervical exam deferred        Extremities: Normal range of motion.  Edema: None  Mental Status: Normal mood and affect. Normal behavior. Normal judgment and thought content.   Urinalysis:      Assessment and Plan:  Pregnancy: G3P2002 at [redacted]w[redacted]d  1. Supervision of high risk pregnancy, antepartum Patient is doing well Rx Diflucan provided [place] and wet prep next visit if vaginitis does not resolve  2. Previous cesarean section  complicating pregnancy, antepartum condition or complication Patient scheduled for repeat on 11/15  3. Elderly multigravida in third trimester Declined testing  4. Susceptible to Varicella (non-immune), currently pregnant in third trimester Will offer postpartum  Preterm labor symptoms and general obstetric precautions including but not limited to vaginal bleeding, contractions, leaking of fluid and fetal movement were reviewed in detail with the patient. Please refer to After Visit Summary for other counseling recommendations.  Return in about 2 weeks (around 01/09/2016).  Mora Bellman, MD

## 2016-01-09 ENCOUNTER — Ambulatory Visit (INDEPENDENT_AMBULATORY_CARE_PROVIDER_SITE_OTHER): Payer: Medicaid Other | Admitting: Obstetrics & Gynecology

## 2016-01-09 VITALS — BP 109/81 | HR 85 | Temp 98.9°F | Wt 162.4 lb

## 2016-01-09 DIAGNOSIS — O09523 Supervision of elderly multigravida, third trimester: Secondary | ICD-10-CM

## 2016-01-09 DIAGNOSIS — O34219 Maternal care for unspecified type scar from previous cesarean delivery: Secondary | ICD-10-CM

## 2016-01-09 DIAGNOSIS — O0990 Supervision of high risk pregnancy, unspecified, unspecified trimester: Secondary | ICD-10-CM

## 2016-01-09 NOTE — Progress Notes (Signed)
Patient states that she has contractions that come and go, reports good fetal movement.

## 2016-01-09 NOTE — Patient Instructions (Signed)
Cesarean Delivery, Care After  Refer to this sheet in the next few weeks. These instructions provide you with information on caring for yourself after your procedure. Your health care provider may also give you specific instructions. Your treatment has been planned according to current medical practices, but problems sometimes occur. Call your health care provider if you have any problems or questions after you go home.  HOME CARE INSTRUCTIONS   Only take over-the-counter or prescription medications as directed by your health care provider.   Do not drink alcohol, especially if you are breastfeeding or taking medication to relieve pain.   Do not chew or smoke tobacco.   Continue to use good perineal care. Good perineal care includes:    Wiping your perineum from front to back.    Keeping your perineum clean.   Check your surgical cut (incision) daily for increased redness, drainage, swelling, or separation of skin.   Clean your incision gently with soap and water every day, and then pat it dry. If your health care provider says it is okay, leave the incision uncovered. Use a bandage (dressing) if the incision is draining fluid or appears irritated. If the adhesive strips across the incision do not fall off within 7 days, carefully peel them off.   Hug a pillow when coughing or sneezing until your incision is healed. This helps to relieve pain.   Do not use tampons or douche until your health care provider says it is okay.   Shower, wash your hair, and take tub baths as directed by your health care provider.   Wear a well-fitting bra that provides breast support.   Limit wearing support panties or control-top hose.   Drink enough fluids to keep your urine clear or pale yellow.   Eat high-fiber foods such as whole grain cereals and breads, brown rice, beans, and fresh fruits and vegetables every day. These foods may help prevent or relieve constipation.   Resume activities such as climbing stairs,  driving, lifting, exercising, or traveling as directed by your health care provider.   Talk to your health care provider about resuming sexual activities. This is dependent upon your risk of infection, your rate of healing, and your comfort and desire to resume sexual activity.   Try to have someone help you with your household activities and your newborn for at least a few days after you leave the hospital.   Rest as much as possible. Try to rest or take a nap when your newborn is sleeping.   Increase your activities gradually.   Keep all of your scheduled postpartum appointments. It is very important to keep your scheduled follow-up appointments. At these appointments, your health care provider will be checking to make sure that you are healing physically and emotionally.  SEEK MEDICAL CARE IF:    You are passing large clots from your vagina. Save any clots to show your health care provider.   You have a foul smelling discharge from your vagina.   You have trouble urinating.   You are urinating frequently.   You have pain when you urinate.   You have a change in your bowel movements.   You have increasing redness, pain, or swelling near your incision.   You have pus draining from your incision.   Your incision is separating.   You have painful, hard, or reddened breasts.   You have a severe headache.   You have blurred vision or see spots.   You feel sad   or depressed.   You have thoughts of hurting yourself or your newborn.   You have questions about your care, the care of your newborn, or medications.   You are dizzy or light-headed.   You have a rash.   You have pain, redness, or swelling at the site of the removed intravenous access (IV) tube.   You have nausea or vomiting.   You stopped breastfeeding and have not had a menstrual period within 12 weeks of stopping.   You are not breastfeeding and have not had a menstrual period within 12 weeks of delivery.   You have a fever.  SEEK  IMMEDIATE MEDICAL CARE IF:   You have persistent pain.   You have chest pain.   You have shortness of breath.   You faint.   You have leg pain.   You have stomach pain.   Your vaginal bleeding saturates 2 or more sanitary pads in 1 hour.  MAKE SURE YOU:    Understand these instructions.   Will watch your condition.   Will get help right away if you are not doing well or get worse.     This information is not intended to replace advice given to you by your health care provider. Make sure you discuss any questions you have with your health care provider.     Document Released: 11/22/2001 Document Revised: 03/23/2014 Document Reviewed: 10/28/2011  Elsevier Interactive Patient Education 2016 Elsevier Inc.

## 2016-01-09 NOTE — Progress Notes (Signed)
   PRENATAL VISIT NOTE  Subjective:  Courtney Johns is a 35 y.o. G3P2002 at [redacted]w[redacted]d being seen today for ongoing prenatal care.  She is currently monitored for the following issues for this high-risk pregnancy and has Advanced maternal age in multigravida; Supervision of high risk pregnancy, antepartum; Previous cesarean section complicating pregnancy, antepartum condition or complication; and Susceptible to Varicella (non-immune), currently pregnant in third trimester on her problem list.  Patient reports no complaints.  Contractions: Irregular. Vag. Bleeding: None.  Movement: Present. Denies leaking of fluid.   The following portions of the patient's history were reviewed and updated as appropriate: allergies, current medications, past family history, past medical history, past social history, past surgical history and problem list. Problem list updated.  Objective:   Vitals:   01/09/16 1118  BP: 109/81  Pulse: 85  Temp: 98.9 F (37.2 C)  Weight: 73.7 kg (162 lb 6.4 oz)    Fetal Status: Fetal Heart Rate (bpm): 147 Fundal Height: 35 cm Movement: Present     General:  Alert, oriented and cooperative. Patient is in no acute distress.  Skin: Skin is warm and dry. No rash noted.   Cardiovascular: Normal heart rate noted  Respiratory: Normal respiratory effort, no problems with respiration noted  Abdomen: Soft, gravid, appropriate for gestational age. Pain/Pressure: Present     Pelvic:  Cervical exam deferred        Extremities: Normal range of motion.  Edema: None  Mental Status: Normal mood and affect. Normal behavior. Normal judgment and thought content.   Assessment and Plan:  Pregnancy: G3P2002 at [redacted]w[redacted]d  1. Supervision of high risk pregnancy, antepartum CS scheduled 11/15 - Strep Gp B NAA  2. Elderly multigravida in third trimester Doing well, HB prevented with Prilosec   3. Previous cesarean section complicating pregnancy, antepartum condition or complication 39 week  delivery  Preterm labor symptoms and general obstetric precautions including but not limited to vaginal bleeding, contractions, leaking of fluid and fetal movement were reviewed in detail with the patient. Please refer to After Visit Summary for other counseling recommendations.  Return in about 1 week (around 01/16/2016).  Woodroe Mode, MD

## 2016-01-11 LAB — STREP GP B NAA: STREP GROUP B AG: POSITIVE — AB

## 2016-01-14 ENCOUNTER — Telehealth (HOSPITAL_COMMUNITY): Payer: Self-pay | Admitting: *Deleted

## 2016-01-14 NOTE — Telephone Encounter (Signed)
Preadmission screen  

## 2016-01-16 ENCOUNTER — Encounter: Payer: Medicaid Other | Admitting: Obstetrics & Gynecology

## 2016-01-17 ENCOUNTER — Ambulatory Visit (INDEPENDENT_AMBULATORY_CARE_PROVIDER_SITE_OTHER): Payer: Medicaid Other | Admitting: Obstetrics & Gynecology

## 2016-01-17 ENCOUNTER — Encounter (HOSPITAL_COMMUNITY): Payer: Self-pay

## 2016-01-17 VITALS — Temp 98.5°F | Wt 160.2 lb

## 2016-01-17 DIAGNOSIS — O34219 Maternal care for unspecified type scar from previous cesarean delivery: Secondary | ICD-10-CM | POA: Diagnosis not present

## 2016-01-17 DIAGNOSIS — O09523 Supervision of elderly multigravida, third trimester: Secondary | ICD-10-CM

## 2016-01-17 DIAGNOSIS — O0990 Supervision of high risk pregnancy, unspecified, unspecified trimester: Secondary | ICD-10-CM

## 2016-01-17 NOTE — Progress Notes (Signed)
   PRENATAL VISIT NOTE  Subjective:  Courtney Johns is a 35 y.o. G3P2002 at [redacted]w[redacted]d being seen today for ongoing prenatal care.  She is currently monitored for the following issues for this high-risk pregnancy and has Advanced maternal age in multigravida; Supervision of high risk pregnancy, antepartum; Previous cesarean section complicating pregnancy, antepartum condition or complication; and Susceptible to Varicella (non-immune), currently pregnant in third trimester on her problem list.  Patient reports no complaints.  Contractions: Irregular. Vag. Bleeding: None.  Movement: Present. Denies leaking of fluid.   The following portions of the patient's history were reviewed and updated as appropriate: allergies, current medications, past family history, past medical history, past social history, past surgical history and problem list. Problem list updated.  Objective:   Vitals:   01/17/16 1053  Temp: 98.5 F (36.9 C)  Weight: 160 lb 3.2 oz (72.7 kg)    Fetal Status: Fetal Heart Rate (bpm): 156 Fundal Height: 37 cm Movement: Present     General:  Alert, oriented and cooperative. Patient is in no acute distress.  Skin: Skin is warm and dry. No rash noted.   Cardiovascular: Normal heart rate noted  Respiratory: Normal respiratory effort, no problems with respiration noted  Abdomen: Soft, gravid, appropriate for gestational age. Pain/Pressure: Present     Pelvic:  Cervical exam deferred        Extremities: Normal range of motion.  Edema: None  Mental Status: Normal mood and affect. Normal behavior. Normal judgment and thought content.   Assessment and Plan:  Pregnancy: G3P2002 at [redacted]w[redacted]d  1. Previous cesarean section complicating pregnancy, antepartum condition or complication Already scheduled for RCS on 01/29/16  2. Advanced maternal age in multigravida, third trimester 3. Supervision of high risk pregnancy, antepartum Term labor symptoms and general obstetric precautions including  but not limited to vaginal bleeding, contractions, leaking of fluid and fetal movement were reviewed in detail with the patient. Please refer to After Visit Summary for other counseling recommendations.  Return in about 1 week (around 01/24/2016) for OB Visit.   Osborne Oman, MD

## 2016-01-17 NOTE — Patient Instructions (Signed)
Return to clinic for any scheduled appointments or obstetric concerns, or go to [facility] for evaluation  

## 2016-01-17 NOTE — Progress Notes (Signed)
Patient is in the office states that she feels good, reports good fetal movement.

## 2016-01-27 ENCOUNTER — Encounter: Payer: Medicaid Other | Admitting: Obstetrics and Gynecology

## 2016-01-27 ENCOUNTER — Encounter (HOSPITAL_COMMUNITY)
Admission: RE | Admit: 2016-01-27 | Discharge: 2016-01-27 | Disposition: A | Discharge status: Home / Self Care | Payer: Medicaid Other | Source: Ambulatory Visit | Attending: Obstetrics and Gynecology | Admitting: Obstetrics and Gynecology

## 2016-01-27 LAB — CBC
HEMATOCRIT: 32.4 % — AB (ref 36.0–46.0)
HEMOGLOBIN: 10.8 g/dL — AB (ref 12.0–15.0)
MCH: 29.6 pg (ref 26.0–34.0)
MCHC: 33.3 g/dL (ref 30.0–36.0)
MCV: 88.8 fL (ref 78.0–100.0)
Platelets: 333 10*3/uL (ref 150–400)
RBC: 3.65 MIL/uL — AB (ref 3.87–5.11)
RDW: 13.7 % (ref 11.5–15.5)
WBC: 7.5 10*3/uL (ref 4.0–10.5)

## 2016-01-27 NOTE — Patient Instructions (Signed)
83 Courtney Johns  01/27/2016   Your procedure is scheduled on:  01/29/2016  Enter through the Main Entrance of [facility] at [place] up the phone at the desk and dial [phone].   Call this number if you have problems the morning of surgery: [phone]   Remember:   Do not eat food:After Midnight.  Do not drink clear liquids: After Midnight.  Take these medicines the morning of surgery with A SIP OF WATER: MAY TAKE PRILOSEC   Do not wear jewelry, make-up or nail polish.  Do not wear lotions, powders, or perfumes. Do not wear deodorant.  Do not shave 48 hours prior to surgery.  Do not bring valuables to the hospital.  [facility] is not   responsible for any belongings or valuables brought to the hospital.  Contacts, dentures or bridgework may not be worn into surgery.  Leave suitcase in the car. After surgery it may be brought to your room.  For patients admitted to the hospital, checkout time is 11:00 AM the day of              discharge.   Patients discharged the day of surgery will not be allowed to drive             home.  Name and phone number of your driver: NA  Special Instructions:   N/A   Please read over the following fact sheets that you were given:   Surgical Site Infection Prevention

## 2016-01-28 ENCOUNTER — Ambulatory Visit (INDEPENDENT_AMBULATORY_CARE_PROVIDER_SITE_OTHER): Payer: Medicaid Other | Admitting: Obstetrics and Gynecology

## 2016-01-28 ENCOUNTER — Encounter (HOSPITAL_COMMUNITY)
Admission: RE | Admit: 2016-01-28 | Discharge: 2016-01-28 | Disposition: A | Discharge status: Home / Self Care | Payer: Medicaid Other | Source: Ambulatory Visit | Attending: Obstetrics and Gynecology | Admitting: Obstetrics and Gynecology

## 2016-01-28 VITALS — BP 114/76 | HR 92 | Temp 97.8°F | Wt 161.2 lb

## 2016-01-28 DIAGNOSIS — O34219 Maternal care for unspecified type scar from previous cesarean delivery: Secondary | ICD-10-CM

## 2016-01-28 DIAGNOSIS — O0990 Supervision of high risk pregnancy, unspecified, unspecified trimester: Secondary | ICD-10-CM

## 2016-01-28 DIAGNOSIS — O0993 Supervision of high risk pregnancy, unspecified, third trimester: Secondary | ICD-10-CM

## 2016-01-28 LAB — RPR: RPR: NONREACTIVE

## 2016-01-28 MED ORDER — CEFAZOLIN SODIUM-DEXTROSE 2-4 GM/100ML-% IV SOLN
2.0000 g | INTRAVENOUS | Status: AC
  Administered 2016-01-29: 2 g via INTRAVENOUS

## 2016-01-28 NOTE — Patient Instructions (Signed)
Cesarean Delivery °Cesarean birth, or cesarean delivery, is the surgical delivery of a baby through an incision in the abdomen and the uterus. This may be referred to as a C-section. This procedure may be scheduled ahead of time, or it may be done in an emergency situation. °Tell a health care provider about: °· Any allergies you have. °· All medicines you are taking, including vitamins, herbs, eye drops, creams, and over-the-counter medicines. °· Any problems you or family members have had with anesthetic medicines. °· Any blood disorders you have. °· Any surgeries you have had. °· Any medical conditions you have. °· Whether you or any members of your family have a history of deep vein thrombosis (DVT) or pulmonary embolism (PE). °What are the risks? °Generally, this is a safe procedure. However, problems may occur, including: °· Infection. °· Bleeding. °· Allergic reactions to medicines. °· Damage to other structures or organs. °· Blood clots. °· Injury to your baby. ° °What happens before the procedure? °· Follow instructions from your health care provider about eating or drinking restrictions. °· Follow instructions from your health care provider about bathing before your procedure to help reduce your risk of infection. °· If you know that you are going to have a cesarean delivery, do not shave your pubic area. Shaving before the procedure may increase your risk of infection. °· Ask your health care provider about: °? Changing or stopping your regular medicines. This is especially important if you are taking diabetes medicines or blood thinners. °? Your pain management plan. This is especially important if you plan to breastfeed your baby. °? How long you will be in the hospital after the procedure. °? Any concerns you may have about receiving blood products if you need them during the procedure. °? Cord blood banking, if you plan to collect your baby’s umbilical cord blood. °· You may also want to ask your  health care provider: °? Whether you will be able to hold or breastfeed your baby while you are still in the operating room. °? Whether your baby can stay with you immediately after the procedure and during your recovery. °? Whether a family member or a person of your choice can go with you into the operating room and stay with you during the procedure, immediately after the procedure, and during your recovery. °· Plan to have someone drive you home when you are discharged from the hospital. °What happens during the procedure? °· Fetal monitors will be placed on your abdomen to monitor your heart rate and your baby's heart rate. °· Depending on the reason for your cesarean delivery, you may have a physical exam or additional testing, such as an ultrasound. °· An IV tube will be inserted into one of your veins. °· You may have your blood or urine tested. °· You will be given antibiotic medicine to help prevent infection. °· You may be given a special warming gown to wear to keep your temperature stable. °· Hair may be removed from your pubic area. °· The skin of your pubic area and lower abdomen will be cleaned with a germ-killing solution (antiseptic). °· A catheter may be inserted into your bladder through your urethra. This drains your urine during the procedure. °· You may be given one or more of the following: °? A medicine to numb the area (local anesthetic). °? A medicine to make you fall asleep (general anesthetic). °? A medicine (regional anesthetic) that is injected into your back or through a small   thin tube placed in your back (spinal anesthetic or epidural anesthetic). This numbs everything below the injection site and allows you to stay awake during your procedure. If this makes you feel nauseous, tell your health care provider. Medicines will be available to help reduce any nausea you may feel. °· An incision will be made in your abdomen, and then in your uterus. °· If you are awake during your  procedure, you may feel tugging and pulling in your abdomen, but you should not feel pain. If you feel pain, tell your health care provider immediately. °· Your baby will be removed from your uterus. You may feel more pressure or pushing while this happens. °· Immediately after birth, your baby will be dried and kept warm. You may be able to hold and breastfeed your baby. The umbilical cord may be clamped and cut during this time. °· Your placenta will be removed from your uterus. °· Your incisions will be closed with stitches (sutures). Staples, skin glue, or adhesive strips may also be applied to the incision in your abdomen. °· Bandages (dressings) will be placed over the incision in your abdomen. °The procedure may vary among health care providers and hospitals. °What happens after the procedure? °· Your blood pressure, heart rate, breathing rate, and blood oxygen level will be monitored often until the medicines you were given have worn off. °· You may continue to receive fluids and medicines through an IV tube. °· You will have some pain. Medicines will be available to help control your pain. °· To help prevent blood clots: °? You may be given medicines. °? You may have to wear compression stockings or devices. °? You will be encouraged to walk around when you are able. °· Hospital staff will encourage and support bonding with your baby. Your hospital may allow you and your baby to stay in the same room (rooming in) during your hospital stay to encourage successful breastfeeding. °· You may be encouraged to cough and breathe deeply often. This helps to prevent lung problems. °· If you have a catheter draining your urine, it will be removed as soon as possible after your procedure. °This information is not intended to replace advice given to you by your health care provider. Make sure you discuss any questions you have with your health care provider. °Document Released: 03/02/2005 Document Revised: 08/08/2015  Document Reviewed: 12/11/2014 °Elsevier Interactive Patient Education © 2017 [employer] ° °

## 2016-01-28 NOTE — Progress Notes (Signed)
Subjective:  Courtney Johns is a 35 y.o. G3P2002 at [redacted]w[redacted]d being seen today for ongoing prenatal care.  She is currently monitored for the following issues for this low-risk pregnancy and has Advanced maternal age in multigravida; Supervision of high risk pregnancy, antepartum; Previous cesarean section complicating pregnancy, antepartum condition or complication; and Susceptible to Varicella (non-immune), currently pregnant in third trimester on her problem list.  Patient reports no complaints.  Contractions: Irregular. Vag. Bleeding: None.  Movement: Present. Denies leaking of fluid.   The following portions of the patient's history were reviewed and updated as appropriate: allergies, current medications, past family history, past medical history, past social history, past surgical history and problem list. Problem list updated.  Objective:   Vitals:   01/28/16 1121  BP: 114/76  Pulse: 92  Temp: 97.8 F (36.6 C)  Weight: 161 lb 3.2 oz (73.1 kg)    Fetal Status: Fetal Heart Rate (bpm): 147 Fundal Height: 39 cm Movement: Present     General:  Alert, oriented and cooperative. Patient is in no acute distress.  Skin: Skin is warm and dry. No rash noted.   Cardiovascular: Normal heart rate noted  Respiratory: Normal respiratory effort, no problems with respiration noted  Abdomen: Soft, gravid, appropriate for gestational age. Pain/Pressure: Present     Pelvic:  Cervical exam deferred        Extremities: Normal range of motion.  Edema: None  Mental Status: Normal mood and affect. Normal behavior. Normal judgment and thought content.   Urinalysis:      Assessment and Plan:  Pregnancy: G3P2002 at [redacted]w[redacted]d  1. Supervision of high risk pregnancy, antepartum   2. Previous cesarean section complicating pregnancy, antepartum condition or complication RLTCS tomorrow. R/B/Post op care reviewed  Term labor symptoms and general obstetric precautions including but not limited to vaginal  bleeding, contractions, leaking of fluid and fetal movement were reviewed in detail with the patient. Please refer to After Visit Summary for other counseling recommendations.  Return in about 4 weeks (around 02/25/2016).   Chancy Milroy, MD

## 2016-01-28 NOTE — Progress Notes (Signed)
Patient is in the office and states that she has contractions that come and go, reports good fetal movement.

## 2016-01-29 ENCOUNTER — Inpatient Hospital Stay (HOSPITAL_COMMUNITY)
Admission: RE | Admit: 2016-01-29 | Discharge: 2016-01-31 | DRG: 766 | Disposition: A | Discharge status: Home / Self Care | Payer: Medicaid Other | Source: Ambulatory Visit | Attending: Obstetrics and Gynecology | Admitting: Obstetrics and Gynecology

## 2016-01-29 ENCOUNTER — Encounter (HOSPITAL_COMMUNITY): Payer: Self-pay | Admitting: *Deleted

## 2016-01-29 ENCOUNTER — Inpatient Hospital Stay (HOSPITAL_COMMUNITY): Payer: Medicaid Other | Admitting: Anesthesiology

## 2016-01-29 ENCOUNTER — Encounter (HOSPITAL_COMMUNITY)
Admission: RE | Disposition: A | Discharge status: Home / Self Care | Payer: Self-pay | Source: Ambulatory Visit | Attending: Obstetrics and Gynecology

## 2016-01-29 DIAGNOSIS — Z8249 Family history of ischemic heart disease and other diseases of the circulatory system: Secondary | ICD-10-CM

## 2016-01-29 DIAGNOSIS — K219 Gastro-esophageal reflux disease without esophagitis: Secondary | ICD-10-CM | POA: Diagnosis present

## 2016-01-29 DIAGNOSIS — O9962 Diseases of the digestive system complicating childbirth: Secondary | ICD-10-CM | POA: Diagnosis present

## 2016-01-29 DIAGNOSIS — Z3A39 39 weeks gestation of pregnancy: Secondary | ICD-10-CM

## 2016-01-29 DIAGNOSIS — O0990 Supervision of high risk pregnancy, unspecified, unspecified trimester: Secondary | ICD-10-CM

## 2016-01-29 DIAGNOSIS — O09893 Supervision of other high risk pregnancies, third trimester: Secondary | ICD-10-CM

## 2016-01-29 DIAGNOSIS — O34211 Maternal care for low transverse scar from previous cesarean delivery: Secondary | ICD-10-CM | POA: Diagnosis present

## 2016-01-29 DIAGNOSIS — O99824 Streptococcus B carrier state complicating childbirth: Secondary | ICD-10-CM | POA: Diagnosis present

## 2016-01-29 DIAGNOSIS — O34219 Maternal care for unspecified type scar from previous cesarean delivery: Secondary | ICD-10-CM

## 2016-01-29 DIAGNOSIS — Z98891 History of uterine scar from previous surgery: Secondary | ICD-10-CM

## 2016-01-29 DIAGNOSIS — Z283 Underimmunization status: Secondary | ICD-10-CM

## 2016-01-29 SURGERY — Surgical Case
Anesthesia: Spinal | Site: Abdomen

## 2016-01-29 MED ORDER — MEPERIDINE HCL 25 MG/ML IJ SOLN: 6.2500 mg | INTRAMUSCULAR | Status: DC | PRN

## 2016-01-29 MED ORDER — ONDANSETRON HCL 4 MG/2ML IJ SOLN
INTRAMUSCULAR | Status: DC | PRN
  Administered 2016-01-29: 4 mg via INTRAVENOUS

## 2016-01-29 MED ORDER — LACTATED RINGERS IV SOLN: INTRAVENOUS | Status: DC

## 2016-01-29 MED ORDER — OXYTOCIN 10 UNIT/ML IJ SOLN
INTRAVENOUS | Status: DC | PRN
  Administered 2016-01-29: 40 [IU] via INTRAVENOUS

## 2016-01-29 MED ORDER — PHENYLEPHRINE 40 MCG/ML (10ML) SYRINGE FOR IV PUSH (FOR BLOOD PRESSURE SUPPORT): PREFILLED_SYRINGE | INTRAVENOUS | Status: DC | PRN

## 2016-01-29 MED ORDER — PHENYLEPHRINE 8 MG IN D5W 100 ML (0.08MG/ML) PREMIX OPTIME
INJECTION | INTRAVENOUS | Status: DC | PRN
  Administered 2016-01-29: 50 ug/min via INTRAVENOUS

## 2016-01-29 MED ORDER — OXYCODONE HCL 5 MG PO TABS: 5.0000 mg | ORAL_TABLET | ORAL | Status: DC | PRN

## 2016-01-29 MED ORDER — MORPHINE SULFATE (PF) 0.5 MG/ML IJ SOLN
INTRAMUSCULAR | Status: DC | PRN
  Administered 2016-01-29: .1 mg via INTRATHECAL

## 2016-01-29 MED ORDER — DIPHENHYDRAMINE HCL 50 MG/ML IJ SOLN: 12.5000 mg | INTRAMUSCULAR | Status: DC | PRN

## 2016-01-29 MED ORDER — ONDANSETRON HCL 4 MG/2ML IJ SOLN
INTRAMUSCULAR | Status: AC
Start: 2016-01-29 — End: 2016-01-29
  Filled 2016-01-29: qty 2

## 2016-01-29 MED ORDER — COCONUT OIL OIL: 1.0000 "application " | TOPICAL_OIL | Status: DC | PRN

## 2016-01-29 MED ORDER — MORPHINE SULFATE-NACL 0.5-0.9 MG/ML-% IV SOSY
PREFILLED_SYRINGE | INTRAVENOUS | Status: AC
Start: 2016-01-29 — End: 2016-01-29
  Filled 2016-01-29: qty 1

## 2016-01-29 MED ORDER — ZOLPIDEM TARTRATE 5 MG PO TABS: 5.0000 mg | ORAL_TABLET | Freq: Every evening | ORAL | Status: DC | PRN

## 2016-01-29 MED ORDER — OXYTOCIN 40 UNITS IN LACTATED RINGERS INFUSION - SIMPLE MED: 2.5000 [IU]/h | INTRAVENOUS | Status: AC

## 2016-01-29 MED ORDER — NALBUPHINE HCL 10 MG/ML IJ SOLN: 5.0000 mg | INTRAMUSCULAR | Status: DC | PRN

## 2016-01-29 MED ORDER — NALBUPHINE HCL 10 MG/ML IJ SOLN: 5.0000 mg | Freq: Once | INTRAMUSCULAR | Status: AC | PRN

## 2016-01-29 MED ORDER — SIMETHICONE 80 MG PO CHEW: 80.0000 mg | CHEWABLE_TABLET | ORAL | Status: DC | PRN

## 2016-01-29 MED ORDER — LACTATED RINGERS IV SOLN
Freq: Once | INTRAVENOUS | Status: AC
  Administered 2016-01-29 (×2): via INTRAVENOUS

## 2016-01-29 MED ORDER — ACETAMINOPHEN 325 MG PO TABS: 650.0000 mg | ORAL_TABLET | ORAL | Status: DC | PRN

## 2016-01-29 MED ORDER — PRENATAL MULTIVITAMIN CH
1.0000 | ORAL_TABLET | Freq: Every day | ORAL | Status: DC
  Administered 2016-01-30: 1 via ORAL
  Filled 2016-01-29: qty 1

## 2016-01-29 MED ORDER — LACTATED RINGERS IV SOLN
INTRAVENOUS | Status: DC
  Administered 2016-01-29: 11:00:00 via INTRAVENOUS

## 2016-01-29 MED ORDER — NALOXONE HCL 2 MG/2ML IJ SOSY
1.0000 ug/kg/h | PREFILLED_SYRINGE | INTRAMUSCULAR | Status: DC | PRN
  Filled 2016-01-29: qty 2

## 2016-01-29 MED ORDER — SCOPOLAMINE 1 MG/3DAYS TD PT72
MEDICATED_PATCH | TRANSDERMAL | Status: AC
  Administered 2016-01-29: 1.5 mg via TRANSDERMAL
  Filled 2016-01-29: qty 1

## 2016-01-29 MED ORDER — NALBUPHINE HCL 10 MG/ML IJ SOLN
INTRAMUSCULAR | Status: AC
  Filled 2016-01-29: qty 1

## 2016-01-29 MED ORDER — METOCLOPRAMIDE HCL 5 MG/ML IJ SOLN: 10.0000 mg | Freq: Once | INTRAMUSCULAR | Status: DC | PRN

## 2016-01-29 MED ORDER — TETANUS-DIPHTH-ACELL PERTUSSIS 5-2.5-18.5 LF-MCG/0.5 IM SUSP: 0.5000 mL | Freq: Once | INTRAMUSCULAR | Status: DC

## 2016-01-29 MED ORDER — DIPHENHYDRAMINE HCL 25 MG PO CAPS
25.0000 mg | ORAL_CAPSULE | ORAL | Status: DC | PRN
  Filled 2016-01-29: qty 1

## 2016-01-29 MED ORDER — ONDANSETRON HCL 4 MG/2ML IJ SOLN: 4.0000 mg | Freq: Three times a day (TID) | INTRAMUSCULAR | Status: DC | PRN

## 2016-01-29 MED ORDER — OXYTOCIN 10 UNIT/ML IJ SOLN
INTRAMUSCULAR | Status: AC
  Filled 2016-01-29: qty 4

## 2016-01-29 MED ORDER — SENNOSIDES-DOCUSATE SODIUM 8.6-50 MG PO TABS
2.0000 | ORAL_TABLET | ORAL | Status: DC
Start: 2016-01-30 — End: 2016-01-31
  Administered 2016-01-29 – 2016-01-30 (×2): 2 via ORAL
  Filled 2016-01-29 (×2): qty 2

## 2016-01-29 MED ORDER — DIBUCAINE 1 % RE OINT: 1.0000 "application " | TOPICAL_OINTMENT | RECTAL | Status: DC | PRN

## 2016-01-29 MED ORDER — SIMETHICONE 80 MG PO CHEW
80.0000 mg | CHEWABLE_TABLET | ORAL | Status: DC
Start: 2016-01-30 — End: 2016-01-31
  Administered 2016-01-29 – 2016-01-30 (×2): 80 mg via ORAL
  Filled 2016-01-29 (×2): qty 1

## 2016-01-29 MED ORDER — SODIUM CHLORIDE 0.9 % IR SOLN
Status: DC | PRN
  Administered 2016-01-29: 1

## 2016-01-29 MED ORDER — DIPHENHYDRAMINE HCL 25 MG PO CAPS
25.0000 mg | ORAL_CAPSULE | Freq: Four times a day (QID) | ORAL | Status: DC | PRN
  Administered 2016-01-29 – 2016-01-30 (×4): 25 mg via ORAL
  Filled 2016-01-29 (×4): qty 1

## 2016-01-29 MED ORDER — PHENYLEPHRINE 8 MG IN D5W 100 ML (0.08MG/ML) PREMIX OPTIME
INJECTION | INTRAVENOUS | Status: AC
  Filled 2016-01-29: qty 100

## 2016-01-29 MED ORDER — SODIUM CHLORIDE 0.9% FLUSH: 3.0000 mL | INTRAVENOUS | Status: DC | PRN

## 2016-01-29 MED ORDER — NALBUPHINE HCL 10 MG/ML IJ SOLN
5.0000 mg | Freq: Once | INTRAMUSCULAR | Status: AC | PRN
  Administered 2016-01-29: 5 mg via INTRAVENOUS

## 2016-01-29 MED ORDER — SCOPOLAMINE 1 MG/3DAYS TD PT72
MEDICATED_PATCH | TRANSDERMAL | Status: AC
  Filled 2016-01-29: qty 1

## 2016-01-29 MED ORDER — IBUPROFEN 600 MG PO TABS
600.0000 mg | ORAL_TABLET | Freq: Four times a day (QID) | ORAL | Status: DC
  Administered 2016-01-30 – 2016-01-31 (×5): 600 mg via ORAL
  Filled 2016-01-29 (×5): qty 1

## 2016-01-29 MED ORDER — NALOXONE HCL 0.4 MG/ML IJ SOLN: 0.4000 mg | INTRAMUSCULAR | Status: DC | PRN

## 2016-01-29 MED ORDER — FENTANYL CITRATE (PF) 100 MCG/2ML IJ SOLN
INTRAMUSCULAR | Status: AC
  Filled 2016-01-29: qty 2

## 2016-01-29 MED ORDER — KETOROLAC TROMETHAMINE 30 MG/ML IJ SOLN: 30.0000 mg | Freq: Four times a day (QID) | INTRAMUSCULAR | Status: DC | PRN

## 2016-01-29 MED ORDER — SCOPOLAMINE 1 MG/3DAYS TD PT72
1.0000 | MEDICATED_PATCH | Freq: Once | TRANSDERMAL | Status: DC
  Administered 2016-01-29: 1.5 mg via TRANSDERMAL

## 2016-01-29 MED ORDER — FENTANYL CITRATE (PF) 100 MCG/2ML IJ SOLN: 25.0000 ug | INTRAMUSCULAR | Status: DC | PRN

## 2016-01-29 MED ORDER — FENTANYL CITRATE (PF) 100 MCG/2ML IJ SOLN
INTRAMUSCULAR | Status: DC | PRN
  Administered 2016-01-29: 12.5 ug via INTRATHECAL

## 2016-01-29 MED ORDER — SIMETHICONE 80 MG PO CHEW
80.0000 mg | CHEWABLE_TABLET | Freq: Three times a day (TID) | ORAL | Status: DC
  Administered 2016-01-29 – 2016-01-30 (×4): 80 mg via ORAL
  Filled 2016-01-29 (×5): qty 1

## 2016-01-29 MED ORDER — PANTOPRAZOLE SODIUM 40 MG PO TBEC
40.0000 mg | DELAYED_RELEASE_TABLET | Freq: Every day | ORAL | Status: DC
  Administered 2016-01-29 – 2016-01-31 (×3): 40 mg via ORAL
  Filled 2016-01-29 (×3): qty 1

## 2016-01-29 MED ORDER — OXYCODONE HCL 5 MG PO TABS
10.0000 mg | ORAL_TABLET | ORAL | Status: DC | PRN
  Administered 2016-01-30 – 2016-01-31 (×5): 10 mg via ORAL
  Filled 2016-01-29 (×5): qty 2

## 2016-01-29 MED ORDER — SCOPOLAMINE 1 MG/3DAYS TD PT72: 1.0000 | MEDICATED_PATCH | Freq: Once | TRANSDERMAL | Status: DC

## 2016-01-29 MED ORDER — BUPIVACAINE IN DEXTROSE 0.75-8.25 % IT SOLN
INTRATHECAL | Status: DC | PRN
  Administered 2016-01-29: 10.5 mg via INTRATHECAL

## 2016-01-29 MED ORDER — KETOROLAC TROMETHAMINE 30 MG/ML IJ SOLN
30.0000 mg | Freq: Four times a day (QID) | INTRAMUSCULAR | Status: AC
Start: 2016-01-29 — End: 2016-01-29
  Administered 2016-01-29 (×2): 30 mg via INTRAVENOUS
  Filled 2016-01-29: qty 1

## 2016-01-29 MED ORDER — MENTHOL 3 MG MT LOZG: 1.0000 | LOZENGE | OROMUCOSAL | Status: DC | PRN

## 2016-01-29 MED ORDER — WITCH HAZEL-GLYCERIN EX PADS: 1.0000 "application " | MEDICATED_PAD | CUTANEOUS | Status: DC | PRN

## 2016-01-29 MED ORDER — BUPIVACAINE HCL (PF) 0.5 % IJ SOLN
INTRAMUSCULAR | Status: AC
  Filled 2016-01-29: qty 30

## 2016-01-29 SURGICAL SUPPLY — 47 items
BENZOIN TINCTURE PRP APPL 2/3 (GAUZE/BANDAGES/DRESSINGS) ×3 IMPLANT
CHLORAPREP W/TINT 26ML (MISCELLANEOUS) ×3 IMPLANT
CLAMP CORD UMBIL (MISCELLANEOUS) IMPLANT
CLOSURE STERI STRIP 1/2 X4 (GAUZE/BANDAGES/DRESSINGS) ×3 IMPLANT
CLOTH BEACON ORANGE TIMEOUT ST (SAFETY) ×3 IMPLANT
COVER LIGHT HANDLE  1/PK (MISCELLANEOUS) ×2
COVER LIGHT HANDLE 1/PK (MISCELLANEOUS) ×1 IMPLANT
DECANTER SPIKE VIAL GLASS SM (MISCELLANEOUS) ×3 IMPLANT
DRAPE C SECTION CLR SCREEN (DRAPES) ×3 IMPLANT
DRSG OPSITE POSTOP 4X10 (GAUZE/BANDAGES/DRESSINGS) ×3 IMPLANT
ELECT REM PT RETURN 9FT ADLT (ELECTROSURGICAL) ×3
ELECTRODE REM PT RTRN 9FT ADLT (ELECTROSURGICAL) ×1 IMPLANT
EXTRACTOR VACUUM M CUP 4 TUBE (SUCTIONS) IMPLANT
EXTRACTOR VACUUM M CUP 4' TUBE (SUCTIONS)
GLOVE BIO SURGEON STRL SZ7 (GLOVE) ×3 IMPLANT
GLOVE BIO SURGEON STRL SZ7.5 (GLOVE) ×3 IMPLANT
GLOVE BIOGEL PI IND STRL 7.0 (GLOVE) ×1 IMPLANT
GLOVE BIOGEL PI INDICATOR 7.0 (GLOVE) ×2
GOWN STRL REUS W/TWL 2XL LVL3 (GOWN DISPOSABLE) ×3 IMPLANT
GOWN STRL REUS W/TWL LRG LVL3 (GOWN DISPOSABLE) ×6 IMPLANT
HEMOSTAT ARISTA ABSORB 3G PWDR (MISCELLANEOUS) ×3 IMPLANT
KIT ABG SYR 3ML LUER SLIP (SYRINGE) IMPLANT
NEEDLE HYPO 22GX1.5 SAFETY (NEEDLE) ×3 IMPLANT
NEEDLE HYPO 25X5/8 SAFETYGLIDE (NEEDLE) IMPLANT
NS IRRIG 1000ML POUR BTL (IV SOLUTION) ×3 IMPLANT
PACK C SECTION WH (CUSTOM PROCEDURE TRAY) ×3 IMPLANT
PAD ABD 7.5X8 STRL (GAUZE/BANDAGES/DRESSINGS) ×3 IMPLANT
PAD OB MATERNITY 4.3X12.25 (PERSONAL CARE ITEMS) ×3 IMPLANT
PENCIL SMOKE EVAC W/HOLSTER (ELECTROSURGICAL) ×3 IMPLANT
RTRCTR C-SECT PINK 25CM LRG (MISCELLANEOUS) ×3 IMPLANT
SPONGE DRAIN TRACH 4X4 STRL 2S (GAUZE/BANDAGES/DRESSINGS) ×6 IMPLANT
SPONGE LAP 18X18 X RAY DECT (DISPOSABLE) ×3 IMPLANT
SUT CHROMIC 1 CTX 36 (SUTURE) ×9 IMPLANT
SUT VIC AB 1 CT1 27 (SUTURE) ×4
SUT VIC AB 1 CT1 27XBRD ANTBC (SUTURE) ×2 IMPLANT
SUT VIC AB 2-0 CT1 (SUTURE) ×3 IMPLANT
SUT VIC AB 2-0 CT1 27 (SUTURE) ×2
SUT VIC AB 2-0 CT1 TAPERPNT 27 (SUTURE) ×1 IMPLANT
SUT VIC AB 3-0 CT1 27 (SUTURE) ×4
SUT VIC AB 3-0 CT1 TAPERPNT 27 (SUTURE) ×2 IMPLANT
SUT VIC AB 3-0 SH 27 (SUTURE)
SUT VIC AB 3-0 SH 27X BRD (SUTURE) IMPLANT
SUT VIC AB 4-0 KS 27 (SUTURE) ×3 IMPLANT
SYR BULB IRRIGATION 50ML (SYRINGE) ×3 IMPLANT
SYR CONTROL 10ML LL (SYRINGE) ×3 IMPLANT
TOWEL OR 17X24 6PK STRL BLUE (TOWEL DISPOSABLE) ×3 IMPLANT
TRAY FOLEY CATH SILVER 14FR (SET/KITS/TRAYS/PACK) ×3 IMPLANT

## 2016-01-29 NOTE — Anesthesia Preprocedure Evaluation (Signed)
Anesthesia Evaluation  Patient identified by MRN, date of birth, ID band Patient awake    Reviewed: Allergy & Precautions, NPO status , Patient's Chart, lab work & pertinent test results  Airway Mallampati: II  TM Distance: >3 FB Neck ROM: Full    Dental no notable dental hx.    Pulmonary neg pulmonary ROS,    Pulmonary exam normal breath sounds clear to auscultation       Cardiovascular negative cardio ROS Normal cardiovascular exam Rhythm:Regular Rate:Normal     Neuro/Psych negative neurological ROS  negative psych ROS   GI/Hepatic negative GI ROS, Neg liver ROS,   Endo/Other  negative endocrine ROS  Renal/GU negative Renal ROS  negative genitourinary   Musculoskeletal negative musculoskeletal ROS (+)   Abdominal   Peds negative pediatric ROS (+)  Hematology negative hematology ROS (+)   Anesthesia Other Findings   Reproductive/Obstetrics (+) Pregnancy                             Anesthesia Physical Anesthesia Plan  ASA: II  Anesthesia Plan: Spinal   Post-op Pain Management:    Induction:   Airway Management Planned:   Additional Equipment:   Intra-op Plan:   Post-operative Plan:   Informed Consent: I have reviewed the patients History and Physical, chart, labs and discussed the procedure including the risks, benefits and alternatives for the proposed anesthesia with the patient or authorized representative who has indicated his/her understanding and acceptance.   Dental advisory given  Plan Discussed with: CRNA  Anesthesia Plan Comments:         Anesthesia Quick Evaluation

## 2016-01-29 NOTE — Progress Notes (Signed)
The risks of cesarean section discussed with the patient included but were not limited to: bleeding which may require transfusion or reoperation; infection which may require antibiotics; injury to bowel, bladder, ureters or other surrounding organs; injury to the fetus; need for additional procedures including hysterectomy in the event of a life-threatening hemorrhage; placental abnormalities wth subsequent pregnancies, incisional problems, thromboembolic phenomenon and other postoperative/anesthesia complications. The patient concurred with the proposed plan, giving informed written consent for the procedure.   Anesthesia and OR aware. Preoperative prophylactic antibiotics and SCDs ordered on call to the OR.  To OR when ready.  Courtney Johns L. Rip Harbour, MD

## 2016-01-29 NOTE — Lactation Note (Addendum)
This note was copied from a baby's chart. Lactation Consultation Note  Patient Name: Courtney Johns [id number] Date: 01/29/2016 Reason for consult: Initial assessment   Initial assessment with Exp BF mom of 2 hour old infant in PACU. Infant was swaddled and being held by FOB. We undressed her and placed her STS with mom, infant slept and was not interested in feeding. Dad reports she was rooting earlier. MOm wanted infant to be taken off and given back to mom. Infant was then swaddled and given back to dad for transfer to PPU.  Enc mom to BF infant 8-12 x in 24 hours at first feeding cues. Enc mom to hand express prior to feeding. Mom with large compressible breasts and areola with large everted nipples.  Hand expressed mom and colostrum easily expressible. Mom would like to supplement infant with Similac. Discussed supply and demand with mom and enc mom to BF prior to offering formula to infant.    Feeding log given with instructions for use. BF Resources Handout and [place] Brochure given, mom informed of IP/OP Services, BF Support Groups and [place] phone #. Enc mom to call out to desk for feeding assistance as needed. Follow up tomorrow and prn.    Maternal Data Formula Feeding for Exclusion: Yes Reason for exclusion: Mother's choice to formula and breast feed on admission Does the patient have breastfeeding experience prior to this delivery?: Yes  Feeding Feeding Type: Breast Fed  LATCH Score/Interventions Latch: Too sleepy or reluctant, no latch achieved, no sucking elicited. Intervention(s): Skin to skin;Teach feeding cues;Waking techniques  Audible Swallowing: None  Type of Nipple: Everted at rest and after stimulation  Comfort (Breast/Nipple): Soft / non-tender     Hold (Positioning): Assistance needed to correctly position infant at breast and maintain latch.  LATCH Score: 5  Lactation Tools Discussed/Used WIC Program: No   Consult Status Consult Status: Follow-up Date:  01/30/16 Follow-up type: In-patient    Debby Freiberg Libni Fusaro 01/29/2016, 1:25 PM

## 2016-01-29 NOTE — Transfer of Care (Signed)
Immediate Anesthesia Transfer of Care Note  Patient: Courtney Johns  Procedure(s) Performed: Procedure(s): CESAREAN SECTION (N/A)  Patient Location: PACU  Anesthesia Type:Spinal  Level of Consciousness: awake, alert , oriented and patient cooperative  Airway & Oxygen Therapy: Patient Spontanous Breathing  Post-op Assessment: Report given to RN and Post -op Vital signs reviewed and stable  Post vital signs: Reviewed and stable  Last Vitals:  Vitals:   01/29/16 1003  BP: 119/86  Pulse: 95  Resp: 16  Temp: 36.9 C    Last Pain:  Vitals:   01/29/16 1003  TempSrc: Oral      Patients Stated Pain Goal: 3 ([id number])  Complications: No apparent anesthesia complications

## 2016-01-29 NOTE — H&P (Signed)
Obstetric Preoperative History and Physical  Courtney Johns is a 35 y.o. [id number] with IUP at [redacted]w[redacted]d presenting for scheduled cesarean section.  No acute concerns. She has had 2 previous c-sections  Prenatal Course Source of Care: [facility]  with onset of care at 22wks and 5 days Pregnancy complications or risks: Patient Active Problem List   Diagnosis Date Noted  . Previous cesarean section complicating pregnancy, antepartum condition or complication [id number]  . Susceptible to Varicella (non-immune), currently pregnant in third trimester 12/10/2015  . Supervision of high risk pregnancy, antepartum 12/01/2015  . Advanced maternal age in multigravida 10/15/2015   She plans to breastfeed, plans to bottle feed She desires IUD for postpartum contraception.   Prenatal labs and studies: ABO, Rh: --/--/O POS (11/13 1150) Antibody: NEG (11/13 1150) Rubella: 26.70 (07/24 1116) RPR: Non Reactive (11/13 1150)  HBsAg: Negative (07/24 1116)  HIV: Non Reactive (08/31 1352)  [id number] (10/26 1251) 2 hr Glucola  normal Genetic screening Declined Anatomy US normal  Prenatal Transfer Tool  Maternal Diabetes: No Genetic Screening: Declined Maternal Ultrasounds/Referrals: Normal Fetal Ultrasounds or other Referrals:  None Maternal Substance Abuse:  No Significant Maternal Medications:  None Significant Maternal Lab Results: Lab values include: Group B Strep negative  Past Medical History:  Diagnosis Date  . Fibroid    4CM anterior  . GERD (gastroesophageal reflux disease)   . Headache(784.0)   . Infection    urinary tract infection  . Malaria     Past Surgical History:  Procedure Laterality Date  . BREAST SURGERY     R Breast  . CESAREAN SECTION  12/24/2011   Procedure: CESAREAN SECTION;  Surgeon: Shelly Bombard, MD;  Location: [facility];  Service: Obstetrics;  Laterality: N/A;  . CESAREAN SECTION N/A 05/13/2013   Procedure: CESAREAN SECTION;  Surgeon: Frederico Hamman, MD;   Location: [facility];  Service: Obstetrics;  Laterality: N/A;    OB History  Gravida Para Term Preterm AB Living  3 2 2  0 0 2  SAB TAB Ectopic Multiple Live Births  0 0 0 0 2    # Outcome Date GA Lbr Len/2nd Weight Sex Delivery Anes PTL Lv  3 Current           2 Term 05/13/13 [redacted]w[redacted]d  6 lb 10.7 oz (3.025 kg) M CS-LTranv EPI  LIV  1 Term 12/24/11 [redacted]w[redacted]d  6 lb 1.5 oz (2.765 kg) M CS-LTranv EPI  LIV      Social History   Social History  . Marital status: Single    Spouse name: N/A  . Number of children: N/A  . Years of education: N/A   Social History Main Topics  . Smoking status: Never Smoker  . Smokeless tobacco: Never Used  . Alcohol use No  . Drug use: No  . Sexual activity: Yes    Birth control/ protection: None   Other Topics Concern  . None   Social History Narrative  . None    Family History  Problem Relation Age of Onset  . Hypertension Mother   . Anesthesia problems Neg Hx   . Other Neg Hx     Prescriptions Prior to Admission  Medication Sig Dispense Refill Last Dose  . diphenhydramine-acetaminophen (TYLENOL PM) 25-500 MG TABS tablet Take 1 tablet by mouth at bedtime as needed (sleep).   01/28/2016 at Unknown time  . Hyprom-Naphaz-Polysorb-Zn Sulf (CLEAR EYES COMPLETE OP) Apply 1 drop to eye daily.   Past Week at Unknown time  .  omeprazole (PRILOSEC) 20 MG capsule Take 20 mg by mouth daily.   01/28/2016 at Unknown time  . Prenatal Vit-Fe Fumarate-FA (VITAFOL-OB) TABS Take 1 tablet by mouth daily before breakfast. 90 each 3 01/28/2016 at Unknown time  . amoxicillin-clavulanate (AUGMENTIN) 875-125 MG tablet Take 1 tablet by mouth 2 (two) times daily. (Patient not taking: Reported on 01/17/2016) 14 tablet 0 Not Taking  . terconazole (TERAZOL 3) 0.8 % vaginal cream Place 1 applicator vaginally at bedtime. (Patient not taking: Reported on 01/17/2016) 20 g 0 Not Taking    No Known Allergies  Review of Systems: Negative except for what is mentioned in HPI.  Physical  Exam: BP 119/86   Pulse 95   Temp 98.4 F (36.9 C) (Oral)   Resp 16   LMP 05/01/2015 (Approximate)   SpO2 100%  FHR by Doppler: 145 bpm CONSTITUTIONAL: Well-developed, well-nourished female in no acute distress.  HENT:  Normocephalic, atraumatic, External right and left ear normal. Oropharynx is clear and moist EYES: Conjunctivae and EOM are normal. Pupils are equal, round, and reactive to light. No scleral icterus.  NECK: Normal range of motion, supple, no masses SKIN: Skin is warm and dry. No rash noted. Not diaphoretic. No erythema. No pallor. [place]: Alert and oriented to person, place, and time. Normal reflexes, muscle tone coordination. No cranial nerve deficit noted. PSYCHIATRIC: Normal mood and affect. Normal behavior. Normal judgment and thought content. CARDIOVASCULAR: Normal heart rate noted, regular rhythm RESPIRATORY: Effort and breath sounds normal, no problems with respiration noted ABDOMEN: Soft, nontender, nondistended, gravid. Well-healed Pfannenstiel incision x 2. PELVIC: Deferred MUSCULOSKELETAL: Normal range of motion. No edema and no tenderness. 2+ distal pulses.   Pertinent Labs/Studies:   Results for orders placed or performed during the hospital encounter of 01/27/16 (from the past 72 hour(s))  CBC     Status: Abnormal   Collection Time: 01/27/16 11:50 AM  Result Value Ref Range   WBC 7.5 4.0 - 10.5 K/uL   RBC 3.65 (L) 3.87 - 5.11 MIL/uL   Hemoglobin 10.8 (L) 12.0 - 15.0 g/dL   HCT 32.4 (L) 36.0 - 46.0 %   MCV 88.8 78.0 - 100.0 fL   MCH 29.6 26.0 - 34.0 pg   MCHC 33.3 30.0 - 36.0 g/dL   RDW 13.7 11.5 - 15.5 %   Platelets 333 150 - 400 K/uL  RPR     Status: None   Collection Time: 01/27/16 11:50 AM  Result Value Ref Range   RPR Ser Ql Non Reactive Non Reactive    Comment: (NOTE) Performed At: [facility] [place], [place] [id number] Lindon Romp MD [id number]   Type and screen     Status: None (Preliminary result)    Collection Time: 01/27/16 11:50 AM  Result Value Ref Range   ABO/RH(D) O POS    Antibody Screen NEG    Sample Expiration 01/30/2016    Unit Number [id number]    Blood Component Type RED CELLS,LR    Unit division 00    Status of Unit ALLOCATED    Transfusion Status OK TO TRANSFUSE    Crossmatch Result COMPATIBLE    Unit Number [id number]    Blood Component Type RED CELLS,LR    Unit division 00    Status of Unit ALLOCATED    Transfusion Status OK TO TRANSFUSE    Crossmatch Result COMPATIBLE     Assessment and Plan :WINNA VOLLE is a 35 y.o. G3P2002 at [redacted]w[redacted]d being admitted for scheduled  cesarean section. The risks of cesarean section discussed with the patient included but were not limited to: bleeding which may require transfusion or reoperation; infection which may require antibiotics; injury to bowel, bladder, ureters or other surrounding organs; injury to the fetus; need for additional procedures including hysterectomy in the event of a life-threatening hemorrhage; placental abnormalities wth subsequent pregnancies, incisional problems, thromboembolic phenomenon and other postoperative/anesthesia complications. The patient concurred with the proposed plan, giving informed written consent for the procedure. Patient has been NPO since last night she will remain NPO for procedure. Anesthesia and OR aware. Preoperative prophylactic antibiotics and SCDs ordered on call to the OR. To OR when ready.    Jacquiline Doe MD [place], [facility] [place]

## 2016-01-29 NOTE — Anesthesia Procedure Notes (Signed)
Spinal  Patient location during procedure: OR Staffing Anesthesiologist: Montez Hageman Performed: anesthesiologist  Preanesthetic Checklist Completed: patient identified, site marked, surgical consent, pre-op evaluation, timeout performed, IV checked, risks and benefits discussed and monitors and equipment checked Spinal Block Patient position: sitting Prep: ChloraPrep Patient monitoring: heart rate, continuous pulse ox and blood pressure Approach: midline Location: L3-4 Injection technique: single-shot Needle Needle type: Sprotte  Needle gauge: 24 G Needle length: 9 cm Additional Notes Expiration date of kit checked and confirmed. Patient tolerated procedure well, without complications.

## 2016-01-29 NOTE — Op Note (Signed)
Cesarean Section Operative Report  Courtney Johns  01/29/2016  Indications: Scheduled Proceedure/Maternal Request   Pre-operative Diagnosis: cpt 59514 - REPEAT LT c-section x's 3.   Post-operative Diagnosis: Same   Surgeon: Surgeon(s) and Role:    * Chancy Milroy, MD - Primary    * Waldemar Dickens, MD - Assisting   Assistants: none  Anesthesia: spinal    Estimated Blood Loss:800  ml  Specimens: none  Findings: Viable female infant in cephalic presentation; Apgars 9 and 6; arterial cord pH not colelcted; clear amniotic fluid; intact placenta with three vessel cord; normal uterus, fallopian tubes and ovaries bilaterally.  Baby condition / location:  Couplet care / Skin to Skin   Complications: no complications  Indications: Courtney Johns is a 35 y.o. [id number] with an IUP [redacted]w[redacted]d presenting for rLTCS previous c-section x3.   Procedure Details:  The patient was taken back to the operative suite where spinal anesthesia was placed.  A time out was held and the above information confirmed.   After induction of anesthesia, the patient was draped and prepped in the usual sterile manner and placed in a dorsal supine position with a leftward tilt. A Pfannenstiel incision was made and carried down through the subcutaneous tissue to the fascia. Fascial incision was made and sharply extended transversely. The fascia was separated from the underlying rectus tissue superiorly and inferiorly. The peritoneum was identified and bluntly entered and extended longitudinally. Aliexis retractor was placed. A low transverse uterine incision was made and extended bluntly. Delivered from cephalic presentation was a viable infant with Apgars as above.  After waiting 60 seconds for delayed cord cutting, the umbilical cord was clamped and cut cord blood was obtained for evaluation. Cord ph was not sent. The placenta was removed Intact and appeared normal. The uterine outline, tubes and ovaries  appeared normal. The uterine incision was closed with running locked sutures of 0chromic gut with an imbricating layer of the same.   Hemostasis was observed. The peritoneum was closed with 3-0 Vicryl. The rectus muscles were examined and hemostasis observed. The fascia was then reapproximated with running sutures of 0coated Vicryl. The subcuticular closure was performed using 0 plan gut. The skin was closed with 4-0Vicryl.   Instrument, sponge, and needle counts were correct prior the abdominal closure and were correct at the conclusion of the case.     Disposition: PACU - hemodynamically stable.    Courtney Doe MD   Signed: Brayton Mars 01/29/2016 3:00 PM

## 2016-01-30 DIAGNOSIS — Z98891 History of uterine scar from previous surgery: Secondary | ICD-10-CM

## 2016-01-30 LAB — CBC
HCT: 27.3 % — ABNORMAL LOW (ref 36.0–46.0)
HEMOGLOBIN: 9.4 g/dL — AB (ref 12.0–15.0)
MCH: 30.6 pg (ref 26.0–34.0)
MCHC: 34.4 g/dL (ref 30.0–36.0)
MCV: 88.9 fL (ref 78.0–100.0)
Platelets: 260 10*3/uL (ref 150–400)
RBC: 3.07 MIL/uL — AB (ref 3.87–5.11)
RDW: 14.2 % (ref 11.5–15.5)
WBC: 9.2 10*3/uL (ref 4.0–10.5)

## 2016-01-30 MED ORDER — POLYSACCHARIDE IRON COMPLEX 150 MG PO CAPS
150.0000 mg | ORAL_CAPSULE | Freq: Two times a day (BID) | ORAL | Status: DC
  Administered 2016-01-30 – 2016-01-31 (×3): 150 mg via ORAL
  Filled 2016-01-30 (×3): qty 1

## 2016-01-30 NOTE — Lactation Note (Signed)
This note was copied from a baby's chart. Lactation Consultation Note: Experienced BF Mom has not latched baby because she was in too much pain. Reports she didn't really even latch after delivery. Reviewed importance of frequent nursing to promote a good milk supply. BF brochure given with resources for support after DC. Encouraged to call for assist when baby ready for feeding. Baby had formula about 1 hour ago and is asleep in bassinet  Patient Name: Courtney Johns [id number] Date: 01/30/2016 Reason for consult: Initial assessment   Maternal Data Formula Feeding for Exclusion: Yes Reason for exclusion: Mother's choice to formula and breast feed on admission Does the patient have breastfeeding experience prior to this delivery?: Yes  Feeding Feeding Type: Formula Nipple Type: Slow - flow  LATCH Score/Interventions                      Lactation Tools Discussed/Used     Consult Status Consult Status: Follow-up Date: 01/31/16 Follow-up type: In-patient    Truddie Crumble 01/30/2016, 12:02 PM

## 2016-01-30 NOTE — Progress Notes (Signed)
Subjective: Postpartum Day #1: Cesarean Delivery Patient reports incisional pain, tolerating PO and no problems voiding.    Objective: Vital signs in last 24 hours: Temp:  [97.6 F (36.4 C)-98.6 F (37 C)] 97.6 F (36.4 C) (11/16 0241) Pulse Rate:  [66-95] 73 (11/16 0241) Resp:  [15-18] 18 (11/16 0241) BP: (102-125)/(71-93) 110/71 (11/16 0241) SpO2:  [97 %-100 %] 97 % (11/15 1831) Weight:  [160 lb (72.6 kg)] 160 lb (72.6 kg) (11/16 0500)  Physical Exam:  General: alert, cooperative and no distress Lochia: appropriate Uterine Fundus: firm Incision: no significant drainage, no dehiscence, no significant erythema, pressure dressing C,D,I DVT Evaluation: No evidence of DVT seen on physical exam. No cords or calf tenderness. No significant calf/ankle edema. SCD's on, ambulation PRN   Recent Labs  01/27/16 1150 01/30/16 0533  HGB 10.8* 9.4*  HCT 32.4* 27.3*    Assessment/Plan: Status post Cesarean section. Doing well postoperatively.  Continue current care.  Morene Crocker, CNM 01/30/2016, 7:13 AM

## 2016-01-30 NOTE — Progress Notes (Signed)
UR chart review completed.  

## 2016-01-31 ENCOUNTER — Encounter (HOSPITAL_COMMUNITY): Payer: Self-pay | Admitting: Obstetrics and Gynecology

## 2016-01-31 LAB — TYPE AND SCREEN
ABO/RH(D): O POS
Antibody Screen: NEGATIVE
Unit division: 0
Unit division: 0

## 2016-01-31 MED ORDER — IBUPROFEN 600 MG PO TABS: 600.0000 mg | ORAL_TABLET | Freq: Four times a day (QID) | ORAL | 2 refills | Status: DC

## 2016-01-31 MED ORDER — SENNOSIDES-DOCUSATE SODIUM 8.6-50 MG PO TABS: 2.0000 | ORAL_TABLET | Freq: Two times a day (BID) | ORAL | 1 refills | Status: DC

## 2016-01-31 MED ORDER — FUSION PLUS PO CAPS: 1.0000 | ORAL_CAPSULE | Freq: Two times a day (BID) | ORAL | 3 refills | Status: DC

## 2016-01-31 MED ORDER — OXYCODONE HCL 10 MG PO TABS: 10.0000 mg | ORAL_TABLET | ORAL | 0 refills | Status: DC | PRN

## 2016-01-31 NOTE — Anesthesia Postprocedure Evaluation (Signed)
Anesthesia Post Note  Patient: Courtney Johns  Procedure(s) Performed: Procedure(s) (LRB): CESAREAN SECTION (N/A)  Patient location during evaluation: PACU Anesthesia Type: Epidural Level of consciousness: awake, awake and alert and oriented Pain management: pain level controlled Vital Signs Assessment: post-procedure vital signs reviewed and stable Respiratory status: spontaneous breathing Cardiovascular status: blood pressure returned to baseline Anesthetic complications: no    Last Vitals:  Vitals:   01/30/16 1826 01/31/16 0540  BP: 104/65 115/76  Pulse: 75 76  Resp: 18 18  Temp:  36.7 C    Last Pain:  Vitals:   01/31/16 0540  TempSrc: Oral  PainSc:                  Montez Hageman

## 2016-01-31 NOTE — Discharge Summary (Signed)
Obstetric Discharge Summary Reason for Admission: RLTCS X3@39  weeks Prenatal Procedures: ultrasound Intrapartum Procedures: cesarean: low cervical, transverse Postpartum Procedures: none Complications-Operative and Postpartum: none Hemoglobin  Date Value Ref Range Status  01/30/2016 9.4 (L) 12.0 - 15.0 g/dL Final   HCT  Date Value Ref Range Status  01/30/2016 27.3 (L) 36.0 - 46.0 % Final   Hematocrit  Date Value Ref Range Status  11/14/2015 31.9 (L) 34.0 - 46.6 % Final    Physical Exam:  General: alert, cooperative and no distress Lochia: appropriate Uterine Fundus: firm Incision: no dehiscence, no significant erythema DVT Evaluation: No evidence of DVT seen on physical exam. No cords or calf tenderness. No significant calf/ankle edema.  Discharge Diagnoses: Term Pregnancy-delivered  Discharge Information: Date: 01/31/2016 Activity: pelvic rest Diet: routine Medications: PNV, Ibuprofen, Colace, Iron and Percocet Condition: stable Instructions: refer to practice specific booklet Discharge to: home Follow-up Information    HARPER,CHARLES A, MD Follow up in 2 week(s).   Specialty:  Obstetrics and Gynecology Contact information: [address] [place] [address] [phone]           Newborn Data: Live born female  Birth Weight: 6 lb 4.2 oz (2840 g) APGAR: 9, 6  Home with mother.  Morene Crocker, CNM 01/31/2016, 8:57 AM

## 2016-02-08 ENCOUNTER — Inpatient Hospital Stay (HOSPITAL_COMMUNITY)
Admission: AD | Admit: 2016-02-08 | Discharge: 2016-02-08 | Disposition: A | Discharge status: Home / Self Care | Payer: Medicaid Other | Source: Ambulatory Visit | Attending: Obstetrics and Gynecology | Admitting: Obstetrics and Gynecology

## 2016-02-08 ENCOUNTER — Encounter (HOSPITAL_COMMUNITY): Payer: Self-pay

## 2016-02-08 DIAGNOSIS — Z9889 Other specified postprocedural states: Secondary | ICD-10-CM | POA: Diagnosis not present

## 2016-02-08 DIAGNOSIS — IMO0001 Reserved for inherently not codable concepts without codable children: Secondary | ICD-10-CM

## 2016-02-08 DIAGNOSIS — T814XXA Infection following a procedure, initial encounter: Secondary | ICD-10-CM | POA: Diagnosis not present

## 2016-02-08 DIAGNOSIS — Z79899 Other long term (current) drug therapy: Secondary | ICD-10-CM | POA: Diagnosis not present

## 2016-02-08 MED ORDER — CEPHALEXIN 500 MG PO CAPS: 500.0000 mg | ORAL_CAPSULE | Freq: Four times a day (QID) | ORAL | 0 refills | Status: DC

## 2016-02-08 NOTE — Discharge Instructions (Signed)
Surgical Site Infections FAQs What is a Surgical Site Infection (SSI)?  A surgical site infection is an infection that occurs after surgery in the part of the body where the surgery took place. Most patients who have surgery do not develop an infection. However, infections develop in about 1 to 3 out of every 100 patients who have surgery. Some of the common symptoms of a surgical site infection are:  Redness and pain around the area where you had surgery  Drainage of cloudy fluid from your surgical wound  Fever Can SSIs be treated?  Yes. Most surgical site infections can be treated with antibiotics. The antibiotic given to you depends on the bacteria (germs) causing the infection. Sometimes patients with SSIs also need another surgery to treat the infection. What are some of the things that hospitals are doing to prevent SSIs?  To prevent SSIs, doctors, nurses, and other healthcare providers:  Clean their hands and arms up to their elbows with an antiseptic agent just before the surgery.  Clean their hands with soap and water or an alcohol-based hand rub before and after caring for each patient.  May remove some of your hair immediately before your surgery using electric clippers if the hair is in the same area where the procedure will occur. They should not shave you with a razor.  Wear special hair covers, masks, gowns, and gloves during surgery to keep the surgery area clean.  Give you antibiotics before your surgery starts. In most cases, you should get antibiotics within 60 minutes before the surgery starts and the antibiotics should be stopped within 24 hours after surgery.  Clean the skin at the site of your surgery with a special soap that kills germs. What can I do to help prevent SSIs?  Before your surgery:  Tell your doctor about other medical problems you may have. Health problems such as allergies, diabetes, and obesity could affect your surgery and your treatment.  Quit  smoking. Patients who smoke get more infections. Talk to your doctor about how you can quit before your surgery.  Do not shave near where you will have surgery. Shaving with a razor can irritate your skin and make it easier to develop an infection. At the time of your surgery:  Speak up if someone tries to shave you with a razor before surgery. Ask why you need to be shaved and talk with your surgeon if you have any concerns.  Ask if you will get antibiotics before surgery. After your surgery:  Make sure that your healthcare providers clean their hands before examining you, either with soap and water or an alcohol-based hand rub.  If you do not see your providers clean their hands, please ask them to do so.  Family and friends who visit you should not touch the surgical wound or dressings.  Family and friends should clean their hands with soap and water or an alcohol-based hand rub before and after visiting you. If you do not see them clean their hands, ask them to clean their hands. What do I need to do when I go home from the hospital?  Before you go home, your doctor or nurse should explain everything you need to know about taking care of your wound. Make sure you understand how to care for your wound before you leave the hospital.  Always clean your hands before and after caring for your wound.  Before you go home, make sure you know who to contact if you have  questions or problems after you get home.  If you have any symptoms of an infection, such as redness and pain at the surgery site, drainage, or fever, call your doctor immediately. If you have additional questions, please ask your doctor or nurse.  Developed and co-sponsored by [employer] for [place] [phone]); Infectious Diseases Society of [place] (IDSA); [place]; Association for Professionals in Infection Control and Epidemiology (APIC); Centers for Disease Control and Prevention  (CDC); and [employer].  This information is not intended to replace advice given to you by your health care provider. Make sure you discuss any questions you have with your health care provider. Document Released: 03/07/2013 Document Revised: 08/08/2015 Document Reviewed: 07/22/2015 Elsevier Interactive Patient Education  2017 [employer].

## 2016-02-08 NOTE — MAU Note (Signed)
Noted drainage from incision last night. 3cm Bulge noted below incision, to the right of middle, part of steri-strips still intact. No redness. Hasn't breast fed in 2 days.  No appetite

## 2016-02-08 NOTE — MAU Provider Note (Signed)
History     CSN: [id number]  Arrival date and time: 02/08/16 1332   First Provider Initiated Contact with Patient 02/08/16 1512      Chief Complaint  Patient presents with  . Post-op Problem  . Wound Check   Courtney Johns is a 35 y.o. G3P3003 who had a c-section on 01/29/16. She states that yesterday she started having a small amount of clear drainage from the site. She also reports that there is a small area that is swollen. She denies any pain. She is breast and bottle feeding, and denies any problems at this time. She reports that her bleeding is minimal at this time.   Wound Check  She was originally treated 5 to 10 days ago. Prior ED Treatment: c-section  Her temperature was unmeasured prior to arrival. There has been clear discharge from the wound. There is no redness present. There is new swelling present. There is no pain present.     Past Medical History:  Diagnosis Date  . Fibroid    4CM anterior  . GERD (gastroesophageal reflux disease)   . Headache(784.0)   . Infection    urinary tract infection  . Malaria     Past Surgical History:  Procedure Laterality Date  . BREAST SURGERY     R Breast  . CESAREAN SECTION  12/24/2011   Procedure: CESAREAN SECTION;  Surgeon: Shelly Bombard, MD;  Location: [facility];  Service: Obstetrics;  Laterality: N/A;  . CESAREAN SECTION N/A 05/13/2013   Procedure: CESAREAN SECTION;  Surgeon: Frederico Hamman, MD;  Location: [facility];  Service: Obstetrics;  Laterality: N/A;  . CESAREAN SECTION N/A 01/29/2016   Procedure: CESAREAN SECTION;  Surgeon: Chancy Milroy, MD;  Location: [facility];  Service: Obstetrics;  Laterality: N/A;    Family History  Problem Relation Age of Onset  . Hypertension Mother   . Anesthesia problems Neg Hx   . Other Neg Hx     Social History  Substance Use Topics  . Smoking status: Never Smoker  . Smokeless tobacco: Never Used  . Alcohol use No    Allergies: No Known  Allergies  Prescriptions Prior to Admission  Medication Sig Dispense Refill Last Dose  . Hyprom-Naphaz-Polysorb-Zn Sulf (CLEAR EYES COMPLETE OP) Apply 1 drop to eye daily.   02/08/2016 at Unknown time  . ibuprofen (ADVIL,MOTRIN) 600 MG tablet Take 1 tablet (600 mg total) by mouth every 6 (six) hours. 120 tablet 2 02/08/2016 at Unknown time  . Iron-FA-B Cmp-C-Biot-Probiotic (FUSION PLUS) CAPS Take 1 tablet by mouth 2 (two) times daily. 60 capsule 3 02/08/2016 at Unknown time  . oxyCODONE 10 MG TABS Take 1 tablet (10 mg total) by mouth every 4 (four) hours as needed (pain scale > 7). 45 tablet 0 02/08/2016 at Unknown time  . Prenatal Vit-Fe Fumarate-FA (VITAFOL-OB) TABS Take 1 tablet by mouth daily before breakfast. 90 each 3 02/08/2016 at Unknown time  . senna-docusate (SENOKOT-S) 8.6-50 MG tablet Take 2 tablets by mouth 2 (two) times daily. 90 tablet 1 02/07/2016 at Unknown time    Review of Systems  Constitutional: Negative for chills and fever.  Gastrointestinal: Negative for nausea and vomiting.  Genitourinary: Negative for dysuria, frequency and urgency.   Physical Exam   Blood pressure 143/87, pulse 69, temperature 98.6 F (37 C), temperature source Oral, resp. rate 16, not currently breastfeeding.  Physical Exam  Nursing note and vitals reviewed. Constitutional: She is oriented to person, place, and time. She appears  well-developed and well-nourished. No distress.  HENT:  Head: Normocephalic.  Cardiovascular: Normal rate.   Respiratory: Effort normal.  GI: Soft. There is no tenderness. There is no rebound.  3cmx2cm bump along the lower portion of the left side of the incision. Incision is not as well healed just above this area. No redness, and mildly tender. No odor.   Musculoskeletal: Normal range of motion.  Neurological: She is alert and oriented to person, place, and time.  Skin: Skin is warm and dry.  Psychiatric: She has a normal mood and affect.    MAU Course   Procedures  MDM  Assessment and Plan   1. Postoperative wound infection, initial encounter    DC home Comfort measures reviewed  RX: keflex QID x 7 days  Return to MAU as needed   [place] Follow up in 1 week(s).   Specialty:  Obstetrics and Gynecology Why:  Call to make an appointment for one week  Contact information: [address], [address] [place], [place] 02/08/2016, 3:16 PM

## 2016-02-12 ENCOUNTER — Ambulatory Visit: Payer: Medicaid Other | Admitting: Certified Nurse Midwife

## 2016-02-12 ENCOUNTER — Encounter: Payer: Self-pay | Admitting: Certified Nurse Midwife

## 2016-02-12 VITALS — BP 130/80 | HR 90 | Wt 145.0 lb

## 2016-02-12 DIAGNOSIS — O900 Disruption of cesarean delivery wound: Secondary | ICD-10-CM | POA: Insufficient documentation

## 2016-02-12 DIAGNOSIS — Z98891 History of uterine scar from previous surgery: Secondary | ICD-10-CM

## 2016-02-12 MED ORDER — CEPHALEXIN 500 MG PO CAPS: 500.0000 mg | ORAL_CAPSULE | Freq: Four times a day (QID) | ORAL | 0 refills | Status: DC

## 2016-02-12 NOTE — Progress Notes (Signed)
Post Partum Exam  Courtney Johns is a 35 y.o. G71P3003 female who presents for a postpartum visit. She is 2 weeks postpartum following a low cervical transverse Cesarean section. I have fully reviewed the prenatal and intrapartum course. The delivery was at 71 gestational weeks.  Anesthesia: spinal. Postpartum course has been complicated by infection in c/s incision. Baby's course has been doing well. Baby is feeding by bottle - Similac Advance. Bleeding thin lochia. Bowel function is abnormal: some constipation. Bladder function is normal. Patient is not sexually active. Contraception method is none.   Note from MAU reviewed; has not finished current round of antibiotics yet.   Postpartum depression screening:neg- score 0  The following portions of the patient's history were reviewed and updated as appropriate: allergies, current medications, past family history, past medical history, past social history, past surgical history and problem list.  Review of Systems Pertinent items are noted in HPI.    Objective:    BP 116/78 mmHg  Pulse 78  Resp 16  Ht 5\' 5"  (1.651 m)  Wt 211 lb (95.709 kg)  BMI 35.11 kg/m2  Breastfeeding? Yes  General:  alert, cooperative and no distress   Breasts:  inspection negative, no nipple discharge or bleeding, no masses or nodularity palpable  Lungs: clear to auscultation bilaterally  Heart:  regular rate and rhythm, S1, S2 normal, no murmur, click, rub or gallop  Abdomen: soft, non-tender; bowel sounds normal; no masses,  no organomegaly and C-section wound: no s/s infection.  Wound edges not well approximated, no tunneling, no puss.  Areas X 2 of 3 cm subcutaneous tissue present       Dr. Jodi Mourning assessed wound and agrees with plan of care.   Steri strips applied to wound after being cleaned with betadine.  ABD dressing applied over steri strips.    Assessment:   Post-op wound infection and wound disruption  Plan:   1.  Wound care management discussed with  patient.  Continue antibiotics for anther week.  2.  Follow up in: 2 weeks or as needed.

## 2016-03-03 ENCOUNTER — Encounter: Payer: Self-pay | Admitting: Obstetrics

## 2016-03-03 ENCOUNTER — Ambulatory Visit (INDEPENDENT_AMBULATORY_CARE_PROVIDER_SITE_OTHER): Payer: Medicaid Other | Admitting: Obstetrics

## 2016-03-03 VITALS — BP 128/76 | HR 88 | Temp 98.7°F | Wt 151.7 lb

## 2016-03-03 DIAGNOSIS — Z3009 Encounter for other general counseling and advice on contraception: Secondary | ICD-10-CM

## 2016-03-03 DIAGNOSIS — Z98891 History of uterine scar from previous surgery: Secondary | ICD-10-CM

## 2016-03-03 DIAGNOSIS — O900 Disruption of cesarean delivery wound: Secondary | ICD-10-CM | POA: Diagnosis not present

## 2016-03-03 NOTE — Progress Notes (Signed)
Patient ID: Courtney Johns, female   DOB: 03/25/1980, 35 y.o.   MRN: [id number]  Chief Complaint  Patient presents with  . other    Incision check  . Contraception    Discuss birtcontrol options    HPI Courtney Johns is a 35 y.o. female.  Presents for incision check.  Would also like to discuss birth control. HPI  Past Medical History:  Diagnosis Date  . Fibroid    4CM anterior  . GERD (gastroesophageal reflux disease)   . Headache(784.0)   . Infection    urinary tract infection  . Malaria     Past Surgical History:  Procedure Laterality Date  . BREAST SURGERY     R Breast  . CESAREAN SECTION  12/24/2011   Procedure: CESAREAN SECTION;  Surgeon: Shelly Bombard, MD;  Location: [facility];  Service: Obstetrics;  Laterality: N/A;  . CESAREAN SECTION N/A 05/13/2013   Procedure: CESAREAN SECTION;  Surgeon: Frederico Hamman, MD;  Location: [facility];  Service: Obstetrics;  Laterality: N/A;  . CESAREAN SECTION N/A 01/29/2016   Procedure: CESAREAN SECTION;  Surgeon: Chancy Milroy, MD;  Location: [facility];  Service: Obstetrics;  Laterality: N/A;    Family History  Problem Relation Age of Onset  . Hypertension Mother   . Anesthesia problems Neg Hx   . Other Neg Hx     Social History Social History  Substance Use Topics  . Smoking status: Never Smoker  . Smokeless tobacco: Never Used  . Alcohol use No    No Known Allergies  Current Outpatient Prescriptions  Medication Sig Dispense Refill  . cephALEXin (KEFLEX) 500 MG capsule Take 1 capsule (500 mg total) by mouth 4 (four) times daily. (Patient not taking: Reported on 03/03/2016) 28 capsule 0  . Hyprom-Naphaz-Polysorb-Zn Sulf (CLEAR EYES COMPLETE OP) Apply 1 drop to eye daily.    Marland Kitchen ibuprofen (ADVIL,MOTRIN) 600 MG tablet Take 1 tablet (600 mg total) by mouth every 6 (six) hours. (Patient not taking: Reported on 03/03/2016) 120 tablet 2  . Iron-FA-B Cmp-C-Biot-Probiotic (FUSION PLUS) CAPS Take 1 tablet by mouth 2  (two) times daily. (Patient not taking: Reported on 03/03/2016) 60 capsule 3  . oxyCODONE 10 MG TABS Take 1 tablet (10 mg total) by mouth every 4 (four) hours as needed (pain scale > 7). (Patient not taking: Reported on 03/03/2016) 45 tablet 0  . Prenatal Vit-Fe Fumarate-FA (VITAFOL-OB) TABS Take 1 tablet by mouth daily before breakfast. (Patient not taking: Reported on 03/03/2016) 90 each 3  . senna-docusate (SENOKOT-S) 8.6-50 MG tablet Take 2 tablets by mouth 2 (two) times daily. (Patient not taking: Reported on 03/03/2016) 90 tablet 1   No current facility-administered medications for this visit.     Review of Systems Review of Systems Constitutional: negative for fatigue and weight loss Respiratory: negative for cough and wheezing Cardiovascular: negative for chest pain, fatigue and palpitations Gastrointestinal: negative for abdominal pain and change in bowel habits Genitourinary:negative Integument/breast: negative for nipple discharge Musculoskeletal:negative for myalgias Neurological: negative for gait problems and tremors Behavioral/Psych: negative for abusive relationship, depression Endocrine: negative for temperature intolerance      Blood pressure 128/76, pulse 88, temperature 98.7 F (37.1 C), temperature source Oral, weight 151 lb 11.2 oz (68.8 kg), not currently breastfeeding.  Physical Exam Physical Exam :    General:  Alert and no distress Abdomen:  Soft, NT.  Incision C, D, I.  Two small areas of granulation tissue cauterized with silver nitrate stiks.  50% of 15 min visit spent on counseling and coordination of care.   Data Reviewed Labs  Assessment     Small reas of wound disruption, healing well except 2 small area of granulation tissue which were cauterized with silver nitrate.  Contraceptive Counseling and Advice.  Considering ParaGuard IUD.  Plan    F/U in 2 weeks.  No orders of the defined types were placed in this encounter.  No orders of the defined types were placed in this encounter.

## 2016-03-23 ENCOUNTER — Ambulatory Visit (INDEPENDENT_AMBULATORY_CARE_PROVIDER_SITE_OTHER): Payer: Medicaid Other | Admitting: Obstetrics

## 2016-03-23 ENCOUNTER — Encounter: Payer: Self-pay | Admitting: Obstetrics

## 2016-03-23 VITALS — BP 128/86 | HR 78 | Wt 149.8 lb

## 2016-03-23 DIAGNOSIS — O900 Disruption of cesarean delivery wound: Secondary | ICD-10-CM

## 2016-03-23 DIAGNOSIS — Z30011 Encounter for initial prescription of contraceptive pills: Secondary | ICD-10-CM

## 2016-03-23 DIAGNOSIS — Z3009 Encounter for other general counseling and advice on contraception: Secondary | ICD-10-CM

## 2016-03-23 MED ORDER — NORETHINDRONE-ETH ESTRADIOL 1-35 MG-MCG PO TABS: 1.0000 | ORAL_TABLET | Freq: Every day | ORAL | 11 refills | Status: DC

## 2016-03-23 NOTE — Progress Notes (Signed)
Patient is almost 8 weeks post partum now. Patient reports her incision is doing better. She still has pain at site.

## 2016-03-23 NOTE — Progress Notes (Signed)
Subjective:     Courtney Johns is a 36 y.o. female who presents for a postpartum visit. She is 5 weeks postpartum following a low cervical transverse Cesarean section. I have fully reviewed the prenatal and intrapartum course. The delivery was at 70 gestational weeks. Outcome: repeat cesarean section, low transverse incision. Anesthesia: spinal. Postpartum course has been complicated by small superficial wound disruption. Baby's course has been normal. Baby is feeding by bottle - Similac Advance. Bleeding no bleeding. Bowel function is normal. Bladder function is normal. Patient is not sexually active. Contraception method is abstinence. Postpartum depression screening: negative.  Tobacco, alcohol and substance abuse history reviewed.  Adult immunizations reviewed including TDAP, rubella and varicella.  The following portions of the patient's history were reviewed and updated as appropriate: allergies, current medications, past family history, past medical history, past social history, past surgical history and problem list.  Review of Systems A comprehensive review of systems was negative.   Objective:    BP 128/86   Pulse 78   Wt 149 lb 12.8 oz (67.9 kg)   LMP 03/12/2016 (Approximate)   Breastfeeding? No   BMI 24.18 kg/m   General:  alert and no distress   Breasts:  inspection negative, no nipple discharge or bleeding, no masses or nodularity palpable  Lungs: clear to auscultation bilaterally  Heart:  regular rate and rhythm, S1, S2 normal, no murmur, click, rub or gallop  Abdomen: soft, non-tender; bowel sounds normal; no masses,  no organomegaly and incision is C, D, I.   Vulva:  normal  Vagina: normal vagina  Cervix:  no cervical motion tenderness  Corpus: normal size, contour, position, consistency, mobility, non-tender  Adnexa:  no mass, fullness, tenderness  Rectal Exam: Not performed.          50% of 15 min visit spent on counseling and coordination of care.    Assessment:      Normal postpartum exam. Pap smear not done at today's visit.     Wound disruption is healed and skin is well approximated.  Plan:    1. Contraception: OCP (estrogen/progesterone) 2. Ortho Nonum 1/35 Rx 3. Follow up in: 8 months or as needed.  Pap.  Healthy lifestyle practices reviewed

## 2016-05-06 ENCOUNTER — Ambulatory Visit (INDEPENDENT_AMBULATORY_CARE_PROVIDER_SITE_OTHER): Payer: Self-pay

## 2016-05-06 ENCOUNTER — Ambulatory Visit (HOSPITAL_COMMUNITY)
Admission: EM | Admit: 2016-05-06 | Discharge: 2016-05-06 | Disposition: A | Discharge status: Home / Self Care | Payer: Medicaid Other | Attending: Family Medicine | Admitting: Family Medicine

## 2016-05-06 ENCOUNTER — Encounter (HOSPITAL_COMMUNITY): Payer: Self-pay | Admitting: Emergency Medicine

## 2016-05-06 DIAGNOSIS — R609 Edema, unspecified: Secondary | ICD-10-CM

## 2016-05-06 DIAGNOSIS — S058X1A Other injuries of right eye and orbit, initial encounter: Secondary | ICD-10-CM

## 2016-05-06 DIAGNOSIS — H578 Other specified disorders of eye and adnexa: Secondary | ICD-10-CM

## 2016-05-06 DIAGNOSIS — H5789 Other specified disorders of eye and adnexa: Secondary | ICD-10-CM

## 2016-05-06 DIAGNOSIS — R220 Localized swelling, mass and lump, head: Secondary | ICD-10-CM

## 2016-05-06 MED ORDER — NAPROXEN 500 MG PO TABS
500.0000 mg | ORAL_TABLET | Freq: Two times a day (BID) | ORAL | 0 refills | Status: DC
Start: 2016-05-06 — End: 2016-09-03

## 2016-05-06 MED ORDER — POLYMYXIN B-TRIMETHOPRIM 10000-0.1 UNIT/ML-% OP SOLN: 1.0000 [drp] | OPHTHALMIC | 0 refills | Status: DC

## 2016-05-06 NOTE — ED Triage Notes (Signed)
The patient presented to the [facility] with a complaint of right eye pain and swelling that she stated occurred last night after she fell and struck her eye on an object. The patient's right eye had considerable swelling and bruising.

## 2016-05-06 NOTE — ED Provider Notes (Signed)
CSN: [id number]     Arrival date & time 05/06/16  1458 History   None    Chief Complaint  Patient presents with  . Eye Problem   (Consider location/radiation/quality/duration/timing/severity/associated sxs/prior Treatment) Patient c/o right eye swelling and discomfort.  She fell and struck the arm of a chair with her right eye and she has difficulty opening her eye due to the swelling.   The history is provided by the patient.  Eye Problem  Location:  Right eye Quality:  Aching Severity:  Severe Onset quality:  Sudden Duration:  1 day Timing:  Constant Progression:  Worsening Chronicity:  New Relieved by:  Nothing Worsened by:  Nothing Ineffective treatments:  None tried Associated symptoms: inflammation and redness     Past Medical History:  Diagnosis Date  . Fibroid    4CM anterior  . GERD (gastroesophageal reflux disease)   . Headache(784.0)   . Infection    urinary tract infection  . Malaria    Past Surgical History:  Procedure Laterality Date  . BREAST SURGERY     R Breast  . CESAREAN SECTION  12/24/2011   Procedure: CESAREAN SECTION;  Surgeon: Shelly Bombard, MD;  Location: [facility];  Service: Obstetrics;  Laterality: N/A;  . CESAREAN SECTION N/A 05/13/2013   Procedure: CESAREAN SECTION;  Surgeon: Frederico Hamman, MD;  Location: [facility];  Service: Obstetrics;  Laterality: N/A;  . CESAREAN SECTION N/A 01/29/2016   Procedure: CESAREAN SECTION;  Surgeon: Chancy Milroy, MD;  Location: [facility];  Service: Obstetrics;  Laterality: N/A;   Family History  Problem Relation Age of Onset  . Hypertension Mother   . Anesthesia problems Neg Hx   . Other Neg Hx    Social History  Substance Use Topics  . Smoking status: Never Smoker  . Smokeless tobacco: Never Used  . Alcohol use No   OB History    Gravida Para Term Preterm AB Living   3 3 3  0 0 3   SAB TAB Ectopic Multiple Live Births   0 0 0 0 3     Review of Systems  Constitutional: Negative.    HENT: Negative.   Eyes: Positive for redness.  Respiratory: Negative.   Cardiovascular: Negative.   Gastrointestinal: Negative.   Endocrine: Negative.   Genitourinary: Negative.   Musculoskeletal: Negative.   Allergic/Immunologic: Negative.   Neurological: Negative.   Hematological: Negative.   Psychiatric/Behavioral: Negative.     Allergies  Patient has no known allergies.  Home Medications   Prior to Admission medications   Medication Sig Start Date End Date Taking? Authorizing Provider  norethindrone-ethinyl estradiol 1/35 ([place] 1/35, 28,) tablet Take 1 tablet by mouth daily. 03/23/16  Yes Shelly Bombard, MD  naproxen (NAPROSYN) 500 MG tablet Take 1 tablet (500 mg total) by mouth 2 (two) times daily with a meal. 05/06/16   Lysbeth Penner, FNP  trimethoprim-polymyxin b (POLYTRIM) ophthalmic solution Place 1 drop into the right eye every 4 (four) hours. 05/06/16   Lysbeth Penner, FNP   Meds Ordered and Administered this Visit  Medications - No data to display  BP 137/74 (BP Location: Right Arm)   Pulse 79   Temp 98.7 F (37.1 C) (Oral)   Resp 16   SpO2 100%  No data found.   Physical Exam  Constitutional: She appears well-developed and well-nourished.  HENT:  Head: Normocephalic and atraumatic.  Right Ear: External ear normal.  Left Ear: External ear normal.  Mouth/Throat:  Oropharynx is clear and moist.  Eyes: Conjunctivae and EOM are normal. Pupils are equal, round, and reactive to light.  Right periorbital area erythematous and swollen.  Right eye opened and pupil reacts briskly to light EOMI and denies right eye pain or blurred vision.  Neck: Normal range of motion. Neck supple.  Cardiovascular: Normal rate, regular rhythm and normal heart sounds.   Pulmonary/Chest: Effort normal and breath sounds normal.  Nursing note and vitals reviewed.   Urgent Care Course     Procedures (including critical care time)  Labs Review Labs Reviewed - No data to  display  Imaging Review Dg Orbits  Result Date: 05/06/2016 CLINICAL DATA:  Pain following fall EXAM: ORBITS - 3 VIEW COMPARISON:  None. FINDINGS: Marcelline Deist, water's, and lateral views obtained. No fracture or dislocation is appreciable by radiography. The paranasal sinuses are clear. There is leftward deviation of the nasal septum. IMPRESSION: No evident fracture or dislocation. Sinuses clear. Leftward deviation of nasal septum. If there remains concern for potential intraorbital pathology, CT of the orbits would be the imaging study of choice for further assessment. Electronically Signed   By: Lowella Grip III M.D.   On: 05/06/2016 16:15     Visual Acuity Review  Right Eye Distance: 20/20 Left Eye Distance:   Bilateral Distance:    Right Eye Near:   Left Eye Near:    Bilateral Near:         MDM  Blunt trauma of right eye  Right facial swellin Right eye irritation  Polytrim eye gtt apply one gtt OD every 4 hours prn #42ml Apply ice to right face Naprosyn 500mg  one po bid x 7 days  Reassured no blown orbit or facial fx Did discuss she has deviated septum and may Need to follow up with ENT but would allow swelling to  Go away.  Follow up prn      Lysbeth Penner, FNP 05/06/16 2102    Lysbeth Penner, [place] 05/06/16 2102

## 2016-07-05 ENCOUNTER — Emergency Department (HOSPITAL_COMMUNITY)
Admission: EM | Admit: 2016-07-05 | Discharge: 2016-07-06 | Disposition: A | Discharge status: Home / Self Care | Payer: Medicaid Other | Attending: Emergency Medicine | Admitting: Emergency Medicine

## 2016-07-05 ENCOUNTER — Encounter (HOSPITAL_COMMUNITY): Payer: Self-pay

## 2016-07-05 DIAGNOSIS — Z79899 Other long term (current) drug therapy: Secondary | ICD-10-CM | POA: Insufficient documentation

## 2016-07-05 DIAGNOSIS — H60502 Unspecified acute noninfective otitis externa, left ear: Secondary | ICD-10-CM | POA: Insufficient documentation

## 2016-07-06 MED ORDER — CIPROFLOXACIN-DEXAMETHASONE 0.3-0.1 % OT SUSP: 4.0000 [drp] | Freq: Two times a day (BID) | OTIC | 0 refills | Status: DC

## 2016-07-06 MED ORDER — ACETAMINOPHEN 500 MG PO TABS
1000.0000 mg | ORAL_TABLET | Freq: Once | ORAL | Status: AC
  Administered 2016-07-06: 1000 mg via ORAL
  Filled 2016-07-06: qty 2

## 2016-07-06 NOTE — ED Provider Notes (Signed)
[facility] DEPT Provider Note   CSN: [id number] Arrival date & time: 07/05/16  2318     History   Chief Complaint Chief Complaint  Patient presents with  . Otalgia    HPI Courtney Johns is a 36 y.o. female.  A shunt presents to the emergency department with chief complaint left ear pain. She states that she has had the pain for the past 3 days. She reports taking ibuprofen with no relief. She denies any fever, chills, nausea, or vomiting. Denies any associated sore throat or cough. She denies any pain in her right ear. There is small bump on the inside of her ear which is causing her the pain. The symptoms are worsened with palpation.   The history is provided by the patient. No language interpreter was used.    Past Medical History:  Diagnosis Date  . Fibroid    4CM anterior  . GERD (gastroesophageal reflux disease)   . Headache(784.0)   . Infection    urinary tract infection  . Malaria     Patient Active Problem List   Diagnosis Date Noted  . Cesarean wound disruption 02/12/2016  . S/P repeat low transverse C-section 01/30/2016  . Previous cesarean section complicating pregnancy, antepartum condition or complication 71/24/5809  . Susceptible to Varicella (non-immune), currently pregnant in third trimester 12/10/2015  . Supervision of high risk pregnancy, antepartum 12/01/2015  . Advanced maternal age in multigravida 10/15/2015    Past Surgical History:  Procedure Laterality Date  . BREAST SURGERY     R Breast  . CESAREAN SECTION  12/24/2011   Procedure: CESAREAN SECTION;  Surgeon: Shelly Bombard, MD;  Location: [facility];  Service: Obstetrics;  Laterality: N/A;  . CESAREAN SECTION N/A 05/13/2013   Procedure: CESAREAN SECTION;  Surgeon: Frederico Hamman, MD;  Location: [facility];  Service: Obstetrics;  Laterality: N/A;  . CESAREAN SECTION N/A 01/29/2016   Procedure: CESAREAN SECTION;  Surgeon: Chancy Milroy, MD;  Location: [facility];  Service:  Obstetrics;  Laterality: N/A;    OB History    Gravida Para Term Preterm AB Living   3 3 3  0 0 3   SAB TAB Ectopic Multiple Live Births   0 0 0 0 3       Home Medications    Prior to Admission medications   Medication Sig Start Date End Date Taking? Authorizing Provider  ciprofloxacin-dexamethasone (CIPRODEX) otic suspension Place 4 drops into the left ear 2 (two) times daily. 07/06/16   Montine Circle, PA-C  naproxen (NAPROSYN) 500 MG tablet Take 1 tablet (500 mg total) by mouth 2 (two) times daily with a meal. 05/06/16   Lysbeth Penner, FNP  norethindrone-ethinyl estradiol 1/35 ([place] 1/35, 28,) tablet Take 1 tablet by mouth daily. 03/23/16   Shelly Bombard, MD  trimethoprim-polymyxin b (POLYTRIM) ophthalmic solution Place 1 drop into the right eye every 4 (four) hours. 05/06/16   Lysbeth Penner, FNP    Family History Family History  Problem Relation Age of Onset  . Hypertension Mother   . Anesthesia problems Neg Hx   . Other Neg Hx     Social History Social History  Substance Use Topics  . Smoking status: Never Smoker  . Smokeless tobacco: Never Used  . Alcohol use No     Allergies   Patient has no known allergies.   Review of Systems Review of Systems  All other systems reviewed and are negative.    Physical Exam Updated  Vital Signs BP (!) 149/99 (BP Location: Right Arm)   Pulse 79   Temp 98.6 F (37 C) (Oral)   LMP 06/30/2016   SpO2 100%   Physical Exam  Constitutional: She is oriented to person, place, and time. No distress.  HENT:  Head: Normocephalic and atraumatic.  Very small pustule on the superior surface of the ear canal with mild surrounding erythema TM clear  Eyes: Conjunctivae and EOM are normal. Pupils are equal, round, and reactive to light.  Neck: No tracheal deviation present.  Cardiovascular: Normal rate.   Pulmonary/Chest: Effort normal. No respiratory distress.  Abdominal: Soft.  Musculoskeletal: Normal range of  motion.  Neurological: She is alert and oriented to person, place, and time.  Skin: Skin is warm and dry. She is not diaphoretic.  Psychiatric: Judgment normal.  Nursing note and vitals reviewed.    ED Treatments / Results  Labs (all labs ordered are listed, but only abnormal results are displayed) Labs Reviewed - No data to display  EKG  EKG Interpretation None       Radiology No results found.  Procedures Procedures (including critical care time)  Medications Ordered in ED Medications  acetaminophen (TYLENOL) tablet 1,000 mg (not administered)     Initial Impression / Assessment and Plan / ED Course  I have reviewed the triage vital signs and the nursing notes.  Pertinent labs & imaging results that were available during my care of the patient were reviewed by me and considered in my medical decision making (see chart for details).     Patient with small pustule in ear canal. The pustule was ruptured with insertion of the otoscope, I believe this will give the patient adequate relief now that the pustule can drain. Will also prescribe Ciprodex and give the patient some Tylenol. Recommend primary care follow-up.  Final Clinical Impressions(s) / ED Diagnoses   Final diagnoses:  Acute otitis externa of left ear, unspecified type    New Prescriptions New Prescriptions   CIPROFLOXACIN-DEXAMETHASONE (CIPRODEX) OTIC SUSPENSION    Place 4 drops into the left ear 2 (two) times daily.     Montine Circle, PA-C 07/06/16 0019    Jola Schmidt, MD 07/06/16 [phone]

## 2016-09-03 ENCOUNTER — Encounter (HOSPITAL_COMMUNITY): Payer: Self-pay | Admitting: Emergency Medicine

## 2016-09-03 ENCOUNTER — Ambulatory Visit (HOSPITAL_COMMUNITY)
Admission: EM | Admit: 2016-09-03 | Discharge: 2016-09-03 | Disposition: A | Discharge status: Home / Self Care | Payer: Medicaid Other | Attending: Family Medicine | Admitting: Family Medicine

## 2016-09-03 DIAGNOSIS — S46001A Unspecified injury of muscle(s) and tendon(s) of the rotator cuff of right shoulder, initial encounter: Secondary | ICD-10-CM

## 2016-09-03 DIAGNOSIS — M25511 Pain in right shoulder: Secondary | ICD-10-CM

## 2016-09-03 MED ORDER — PREDNISONE 10 MG (21) PO TBPK: ORAL_TABLET | ORAL | 0 refills | Status: AC

## 2016-09-03 MED ORDER — DICLOFENAC SODIUM 75 MG PO TBEC: 75.0000 mg | DELAYED_RELEASE_TABLET | Freq: Two times a day (BID) | ORAL | 0 refills | Status: AC

## 2016-09-03 NOTE — ED Provider Notes (Signed)
CSN: [id number]     Arrival date & time 09/03/16  1452 History   None    Chief Complaint  Patient presents with  . Shoulder Pain   (Consider location/radiation/quality/duration/timing/severity/associated sxs/prior Treatment) Courtney Johns is a 36 y.o. female with a past history of acid reflux, headache, and malaria, who presents to the [facility] urgent care with a chief complaint of right shoulder pain for 5 months.   The history is provided by the patient.  Shoulder Injury  This is a chronic problem. Episode onset: 5 months. The problem occurs constantly. The problem has not changed since onset.Pertinent negatives include no chest pain, no abdominal pain and no headaches. Exacerbated by: movement and lifting objects. The symptoms are relieved by medications. She has tried nothing for the symptoms. The treatment provided no relief.    Past Medical History:  Diagnosis Date  . Fibroid    4CM anterior  . GERD (gastroesophageal reflux disease)   . Headache(784.0)   . Infection    urinary tract infection  . Malaria    Past Surgical History:  Procedure Laterality Date  . BREAST SURGERY     R Breast  . CESAREAN SECTION  12/24/2011   Procedure: CESAREAN SECTION;  Surgeon: Shelly Bombard, MD;  Location: [facility];  Service: Obstetrics;  Laterality: N/A;  . CESAREAN SECTION N/A 05/13/2013   Procedure: CESAREAN SECTION;  Surgeon: Frederico Hamman, MD;  Location: [facility];  Service: Obstetrics;  Laterality: N/A;  . CESAREAN SECTION N/A 01/29/2016   Procedure: CESAREAN SECTION;  Surgeon: Chancy Milroy, MD;  Location: [facility];  Service: Obstetrics;  Laterality: N/A;   Family History  Problem Relation Age of Onset  . Hypertension Mother   . Anesthesia problems Neg Hx   . Other Neg Hx    Social History  Substance Use Topics  . Smoking status: Never Smoker  . Smokeless tobacco: Never Used  . Alcohol use No   OB History    Gravida Para Term Preterm AB Living   3 3 3   0 0 3   SAB TAB Ectopic Multiple Live Births   0 0 0 0 3     Review of Systems  Constitutional: Negative.   HENT: Negative.   Cardiovascular: Negative for chest pain and palpitations.  Gastrointestinal: Negative for abdominal pain, nausea and vomiting.  Musculoskeletal: Negative for back pain, neck pain and neck stiffness.       Shoulder pain  Skin: Negative.   Neurological: Negative for light-headedness and headaches.    Allergies  Patient has no known allergies.  Home Medications   Prior to Admission medications   Medication Sig Start Date End Date Taking? Authorizing Provider  norethindrone-ethinyl estradiol 1/35 ([place] 1/35, 28,) tablet Take 1 tablet by mouth daily. 03/23/16  Yes Shelly Bombard, MD  diclofenac (VOLTAREN) 75 MG EC tablet Take 1 tablet (75 mg total) by mouth 2 (two) times daily. 09/03/16   Barnet Glasgow, NP  predniSONE (STERAPRED UNI-PAK 21 TAB) 10 MG (21) TBPK tablet Take 6 tablets tomorrow, decrease by 1 each day till finished (6,5,4,3,2,1) 09/03/16   Barnet Glasgow, NP   Meds Ordered and Administered this Visit  Medications - No data to display  BP 125/90 (BP Location: Left Arm)   Pulse 85   Temp 98 F (36.7 C) (Oral)   LMP 08/27/2016 (Approximate)   SpO2 100%   Breastfeeding? No  No data found.   Physical Exam  Constitutional: She is  oriented to person, place, and time. She appears well-developed and well-nourished. No distress.  HENT:  Head: Normocephalic and atraumatic.  Right Ear: External ear normal.  Left Ear: External ear normal.  Eyes: Conjunctivae are normal.  Neck: Normal range of motion.  Cardiovascular: Normal rate and regular rhythm.   Pulmonary/Chest: Effort normal and breath sounds normal.  Musculoskeletal:       Right shoulder: She exhibits decreased range of motion, tenderness and pain. She exhibits no swelling, no deformity, no laceration, no spasm and normal pulse.  Pain and tenderness with palpation of the  infraspinatus muscle, positive empty can test positive arm drop  Neurological: She is alert and oriented to person, place, and time.  Skin: Skin is warm and dry. Capillary refill takes less than 2 seconds. No rash noted. She is not diaphoretic. No erythema.  Psychiatric: She has a normal mood and affect. Her behavior is normal.  Nursing note and vitals reviewed.   Urgent Care Course     Procedures (including critical care time)  Labs Review Labs Reviewed - No data to display  Imaging Review No results found.     MDM   1. Injury of right rotator cuff, initial encounter     Courtney Johns is a 36 y.o. female with a past history of acid reflux, headache, and malaria, who presents to the [facility] urgent care with a chief complaint of right shoulder pain for 5 months.  Signs and symptoms are consistent with an injury to the rotator cuff, provided diclofenac, prednisone, placed arm in a sling, recommend following up with orthopedics.    Barnet Glasgow, NP 09/03/16 1620

## 2016-09-03 NOTE — Discharge Instructions (Signed)
Your signs and symptoms are consistent with a rotator cuff injury. You have been given prednisone, and diclofenac here.. I have provided the contact information for an orthopedist, if your pain persists, follow-up with them as you may need further imaging and evaluation.

## 2016-09-03 NOTE — ED Triage Notes (Signed)
Pt reports right shoulder pain for the last four months.  She has tried massage and OTC antiinflammatories with no relief.  She denies any injury to the shoulder.

## 2016-12-14 ENCOUNTER — Ambulatory Visit (INDEPENDENT_AMBULATORY_CARE_PROVIDER_SITE_OTHER): Payer: Self-pay | Admitting: Orthopaedic Surgery

## 2016-12-14 ENCOUNTER — Ambulatory Visit (INDEPENDENT_AMBULATORY_CARE_PROVIDER_SITE_OTHER): Payer: Self-pay

## 2016-12-14 DIAGNOSIS — M25511 Pain in right shoulder: Secondary | ICD-10-CM

## 2016-12-14 DIAGNOSIS — M542 Cervicalgia: Secondary | ICD-10-CM

## 2016-12-14 DIAGNOSIS — G8929 Other chronic pain: Secondary | ICD-10-CM

## 2016-12-14 MED ORDER — LIDOCAINE HCL 1 % IJ SOLN
3.0000 mL | INTRAMUSCULAR | Status: AC | PRN
  Administered 2016-12-14: 3 mL

## 2016-12-14 MED ORDER — TRAMADOL HCL 50 MG PO TABS: 100.0000 mg | ORAL_TABLET | Freq: Three times a day (TID) | ORAL | 0 refills | Status: AC | PRN

## 2016-12-14 MED ORDER — METHYLPREDNISOLONE ACETATE 40 MG/ML IJ SUSP
40.0000 mg | INTRAMUSCULAR | Status: AC | PRN
  Administered 2016-12-14: 40 mg via INTRA_ARTICULAR

## 2016-12-14 NOTE — Progress Notes (Signed)
Office Visit Note   Patient: Courtney Johns           Date of Birth: 1980-12-23           MRN: [id number] Visit Date: 12/14/2016              Requested by: Shelly Bombard, MD [address] [place], [place] [address] PCP: Shelly Bombard, MD   Assessment & Plan: Visit Diagnoses:  1. Chronic right shoulder pain   2. Neck pain     Plan: Given the severity of her pain, how long the pain is lasted, failure of time, rest, heat, anti-inflammatories and injection as well as the fact that on clinical exam seeing some muscle atrophy and weakness of the rotator cuff and MRI is warranted to rule out pathology in her shoulder area such as a cyst that would lead to these symptoms. Also offered her steroid injection in subacromial space she is agreeable to this and tolerated the injection well. We'll see her back when the MRIs been obtained of her right shoulder. All questions concerns were answered and addressed.  Follow-Up Instructions: Return in about 2 weeks (around 12/28/2016).   Orders:  Orders Placed This Encounter  Procedures  . Large Joint Injection/Arthrocentesis  . XR Shoulder Right  . XR Cervical Spine 2 or 3 views   Meds ordered this encounter  Medications  . traMADol (ULTRAM) 50 MG tablet    Sig: Take 2 tablets (100 mg total) by mouth 3 (three) times daily as needed.    Dispense:  60 tablet    Refill:  0      Procedures: Large Joint Inj Date/Time: 12/14/2016 4:11 PM Performed by: Mcarthur Rossetti Authorized by: Jean Rosenthal Y   Location:  Shoulder Site:  R subacromial bursa Ultrasound Guidance: No   Fluoroscopic Guidance: No   Arthrogram: No   Medications:  3 mL lidocaine 1 %; 40 mg methylPREDNISolone acetate 40 MG/ML     Clinical Data: No additional findings.   Subjective: No chief complaint on file. The patient is a 36 year old who comes in with 8-9 month history of right shoulder pain with no specific injury. This  started hurting significantly after having a child. She's been seen by her primary care physician as well as the emergency room and urgent care due to the shoulder pain. She's tried anti-inflammatories and has already 1 injection. She says she feels like the shoulder is weak. Is waking her up at night and now her pain is all day long and all night long. She is losing sleep because of it is radiating down to the elbow and radial up into her neck as well. She denies any numbness in her hand.  HPI  Review of Systems She denies any chest pain, shortness of breath, fever, chills, nausea, vomiting  Objective: Vital Signs: There were no vitals taken for this visit.  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 There is some slight weakness the rotator cuff of the infraspinatus. She has pain with overhead activities and reaching behind her and it does seem to be all radiating around the shoulder. She does have some pain in her neck because of this but distally her motor and sensory exam of the right upper extremity is entirely normal. Specialty Comments:  No specialty comments  available.  Imaging: Xr Cervical Spine 2 Or 3 Views  Result Date: 12/14/2016 2 views of the cervical spine show well maintained disc spaces but loss of her cervical lordosis with straightening for lateral view suggesting pain.  Xr Shoulder Right  Result Date: 12/14/2016 3 views of the right shoulder show no acute findings. The shoulders well located.    PMFS History: Patient Active Problem List   Diagnosis Date Noted  . Cesarean wound disruption 02/12/2016  . S/P repeat low transverse C-section 01/30/2016  . Previous cesarean section complicating pregnancy, antepartum condition or complication 63/89/3734  . Susceptible to Varicella (non-immune), currently pregnant in  third trimester 12/10/2015  . Supervision of high risk pregnancy, antepartum 12/01/2015  . Advanced maternal age in multigravida 10/15/2015   Past Medical History:  Diagnosis Date  . Fibroid    4CM anterior  . GERD (gastroesophageal reflux disease)   . Headache(784.0)   . Infection    urinary tract infection  . Malaria     Family History  Problem Relation Age of Onset  . Hypertension Mother   . Anesthesia problems Neg Hx   . Other Neg Hx     Past Surgical History:  Procedure Laterality Date  . BREAST SURGERY     R Breast  . CESAREAN SECTION  12/24/2011   Procedure: CESAREAN SECTION;  Surgeon: Shelly Bombard, MD;  Location: [facility];  Service: Obstetrics;  Laterality: N/A;  . CESAREAN SECTION N/A 05/13/2013   Procedure: CESAREAN SECTION;  Surgeon: Frederico Hamman, MD;  Location: [facility];  Service: Obstetrics;  Laterality: N/A;  . CESAREAN SECTION N/A 01/29/2016   Procedure: CESAREAN SECTION;  Surgeon: Chancy Milroy, MD;  Location: [facility];  Service: Obstetrics;  Laterality: N/A;   Social History   Occupational History  . Not on file.   Social History Main Topics  . Smoking status: Never Smoker  . Smokeless tobacco: Never Used  . Alcohol use No  . Drug use: No  . Sexual activity: Not Currently    Partners: Male    Birth control/ protection: None, Abstinence

## 2016-12-15 ENCOUNTER — Other Ambulatory Visit (INDEPENDENT_AMBULATORY_CARE_PROVIDER_SITE_OTHER): Payer: Self-pay

## 2016-12-15 DIAGNOSIS — G8929 Other chronic pain: Secondary | ICD-10-CM

## 2016-12-15 DIAGNOSIS — M25511 Pain in right shoulder: Principal | ICD-10-CM

## 2016-12-21 ENCOUNTER — Emergency Department (HOSPITAL_COMMUNITY)
Admission: EM | Admit: 2016-12-21 | Discharge: 2016-12-21 | Disposition: A | Discharge status: Home / Self Care | Payer: No Typology Code available for payment source | Attending: Emergency Medicine | Admitting: Emergency Medicine

## 2016-12-21 ENCOUNTER — Encounter (HOSPITAL_COMMUNITY): Payer: Self-pay | Admitting: Emergency Medicine

## 2016-12-21 ENCOUNTER — Emergency Department (HOSPITAL_COMMUNITY): Payer: No Typology Code available for payment source

## 2016-12-21 DIAGNOSIS — S40011A Contusion of right shoulder, initial encounter: Secondary | ICD-10-CM | POA: Diagnosis not present

## 2016-12-21 DIAGNOSIS — Y92410 Unspecified street and highway as the place of occurrence of the external cause: Secondary | ICD-10-CM | POA: Insufficient documentation

## 2016-12-21 DIAGNOSIS — Y999 Unspecified external cause status: Secondary | ICD-10-CM | POA: Diagnosis not present

## 2016-12-21 DIAGNOSIS — Y939 Activity, unspecified: Secondary | ICD-10-CM | POA: Diagnosis not present

## 2016-12-21 DIAGNOSIS — S4991XA Unspecified injury of right shoulder and upper arm, initial encounter: Secondary | ICD-10-CM | POA: Diagnosis present

## 2016-12-21 DIAGNOSIS — R1031 Right lower quadrant pain: Secondary | ICD-10-CM | POA: Diagnosis not present

## 2016-12-21 LAB — CBC WITH DIFFERENTIAL/PLATELET
Basophils Absolute: 0 10*3/uL (ref 0.0–0.1)
Basophils Relative: 0 %
Eosinophils Absolute: 0.1 10*3/uL (ref 0.0–0.7)
Eosinophils Relative: 1 %
HEMATOCRIT: 34.9 % — AB (ref 36.0–46.0)
HEMOGLOBIN: 11.9 g/dL — AB (ref 12.0–15.0)
LYMPHS ABS: 2 10*3/uL (ref 0.7–4.0)
LYMPHS PCT: 36 %
MCH: 31.1 pg (ref 26.0–34.0)
MCHC: 34.1 g/dL (ref 30.0–36.0)
MCV: 91.1 fL (ref 78.0–100.0)
MONOS PCT: 6 %
Monocytes Absolute: 0.4 10*3/uL (ref 0.1–1.0)
NEUTROS ABS: 3.2 10*3/uL (ref 1.7–7.7)
NEUTROS PCT: 57 %
Platelets: 398 10*3/uL (ref 150–400)
RBC: 3.83 MIL/uL — ABNORMAL LOW (ref 3.87–5.11)
RDW: 13.4 % (ref 11.5–15.5)
WBC: 5.7 10*3/uL (ref 4.0–10.5)

## 2016-12-21 LAB — URINALYSIS, ROUTINE W REFLEX MICROSCOPIC
Bilirubin Urine: NEGATIVE
Glucose, UA: NEGATIVE mg/dL
Hgb urine dipstick: NEGATIVE
KETONES UR: NEGATIVE mg/dL
LEUKOCYTES UA: NEGATIVE
Nitrite: NEGATIVE
PROTEIN: NEGATIVE mg/dL
Specific Gravity, Urine: 1.019 (ref 1.005–1.030)
pH: 5 (ref 5.0–8.0)

## 2016-12-21 LAB — COMPREHENSIVE METABOLIC PANEL
ALBUMIN: 3.7 g/dL (ref 3.5–5.0)
ALK PHOS: 42 U/L (ref 38–126)
ALT: 17 U/L (ref 14–54)
ANION GAP: 7 (ref 5–15)
AST: 22 U/L (ref 15–41)
BUN: 10 mg/dL (ref 6–20)
CHLORIDE: 101 mmol/L (ref 101–111)
CO2: 25 mmol/L (ref 22–32)
Calcium: 8.6 mg/dL — ABNORMAL LOW (ref 8.9–10.3)
Creatinine, Ser: 0.55 mg/dL (ref 0.44–1.00)
GFR calc Af Amer: >60 mL/min (ref >60)
GFR calc non Af Amer: >60 mL/min (ref >60)
Glucose, Bld: 88 mg/dL (ref 65–99)
POTASSIUM: 4.2 mmol/L (ref 3.5–5.1)
SODIUM: 133 mmol/L — AB (ref 135–145)
Total Bilirubin: 0.5 mg/dL (ref 0.3–1.2)
Total Protein: 7.1 g/dL (ref 6.5–8.1)

## 2016-12-21 LAB — I-STAT BETA HCG BLOOD, ED (MC, WL, AP ONLY): I-stat hCG, quantitative: <5 m[IU]/mL (ref <5)

## 2016-12-21 LAB — LIPASE, BLOOD: LIPASE: 26 U/L (ref 11–51)

## 2016-12-21 MED ORDER — ACETAMINOPHEN 500 MG PO TABS
1000.0000 mg | ORAL_TABLET | Freq: Once | ORAL | Status: AC
  Administered 2016-12-21: 1000 mg via ORAL
  Filled 2016-12-21: qty 2

## 2016-12-21 MED ORDER — IOPAMIDOL (ISOVUE-300) INJECTION 61%
INTRAVENOUS | Status: AC
  Administered 2016-12-21: 100 mL
  Filled 2016-12-21: qty 100

## 2016-12-21 NOTE — ED Notes (Signed)
Pt returns from xray
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
## 2016-12-28 ENCOUNTER — Ambulatory Visit (INDEPENDENT_AMBULATORY_CARE_PROVIDER_SITE_OTHER): Payer: Medicaid Other | Admitting: Orthopaedic Surgery

## 2017-04-21 ENCOUNTER — Other Ambulatory Visit: Payer: Self-pay | Admitting: Obstetrics

## 2017-04-21 DIAGNOSIS — Z30011 Encounter for initial prescription of contraceptive pills: Secondary | ICD-10-CM

## 2017-04-21 DIAGNOSIS — Z3009 Encounter for other general counseling and advice on contraception: Secondary | ICD-10-CM

## 2018-05-20 ENCOUNTER — Other Ambulatory Visit: Payer: Self-pay | Admitting: Obstetrics

## 2018-05-20 DIAGNOSIS — Z3041 Encounter for surveillance of contraceptive pills: Secondary | ICD-10-CM

## 2018-05-20 MED ORDER — NORETHINDRONE-ETH ESTRADIOL 1-35 MG-MCG PO TABS: 1.0000 | ORAL_TABLET | Freq: Every day | ORAL | 11 refills | Status: DC

## 2019-01-02 ENCOUNTER — Other Ambulatory Visit: Payer: Self-pay | Admitting: Internal Medicine

## 2019-01-02 ENCOUNTER — Other Ambulatory Visit: Payer: Self-pay | Admitting: Obstetrics

## 2019-01-02 ENCOUNTER — Other Ambulatory Visit: Payer: Self-pay

## 2019-01-02 DIAGNOSIS — Z1231 Encounter for screening mammogram for malignant neoplasm of breast: Secondary | ICD-10-CM

## 2019-01-04 ENCOUNTER — Other Ambulatory Visit (HOSPITAL_COMMUNITY): Payer: Self-pay | Admitting: *Deleted

## 2019-01-04 DIAGNOSIS — N644 Mastodynia: Secondary | ICD-10-CM

## 2019-01-26 ENCOUNTER — Ambulatory Visit
Admission: RE | Admit: 2019-01-26 | Discharge: 2019-01-26 | Disposition: A | Discharge status: Home / Self Care | Payer: No Typology Code available for payment source | Source: Ambulatory Visit | Attending: Obstetrics and Gynecology | Admitting: Obstetrics and Gynecology

## 2019-01-26 ENCOUNTER — Ambulatory Visit
Admission: RE | Admit: 2019-01-26 | Discharge: 2019-01-26 | Disposition: A | Discharge status: Home / Self Care | Payer: Medicaid Other | Source: Ambulatory Visit | Attending: Obstetrics and Gynecology | Admitting: Obstetrics and Gynecology

## 2019-01-26 ENCOUNTER — Ambulatory Visit (HOSPITAL_COMMUNITY)
Admission: RE | Admit: 2019-01-26 | Discharge: 2019-01-26 | Disposition: A | Discharge status: Home / Self Care | Payer: Self-pay | Source: Ambulatory Visit | Attending: Obstetrics and Gynecology | Admitting: Obstetrics and Gynecology

## 2019-01-26 ENCOUNTER — Ambulatory Visit: Payer: Self-pay

## 2019-01-26 ENCOUNTER — Encounter (HOSPITAL_COMMUNITY): Payer: Self-pay

## 2019-01-26 ENCOUNTER — Ambulatory Visit (HOSPITAL_COMMUNITY): Payer: No Typology Code available for payment source | Attending: Obstetrics and Gynecology

## 2019-01-26 ENCOUNTER — Ambulatory Visit (HOSPITAL_COMMUNITY): Payer: Self-pay

## 2019-01-26 ENCOUNTER — Other Ambulatory Visit: Payer: Self-pay

## 2019-01-26 ENCOUNTER — Ambulatory Visit (HOSPITAL_COMMUNITY)
Admission: RE | Admit: 2019-01-26 | Discharge: 2019-01-26 | Disposition: A | Discharge status: Home / Self Care | Payer: No Typology Code available for payment source | Source: Ambulatory Visit | Attending: Obstetrics and Gynecology | Admitting: Obstetrics and Gynecology

## 2019-01-26 DIAGNOSIS — N644 Mastodynia: Secondary | ICD-10-CM

## 2019-01-26 DIAGNOSIS — Z1239 Encounter for other screening for malignant neoplasm of breast: Secondary | ICD-10-CM | POA: Insufficient documentation

## 2019-01-26 DIAGNOSIS — N6311 Unspecified lump in the right breast, upper outer quadrant: Secondary | ICD-10-CM | POA: Insufficient documentation

## 2019-01-26 NOTE — Patient Instructions (Signed)
Explained breast self awareness with [employer]. Patient did not need a Pap smear today due to last Pap smear and HPV typing was 10/07/2015. Let her know [facility] will cover Pap smears and HPV typing every 5 years unless has a history of abnormal Pap smears. Referred patient to the [facility] for a diagnostic mammogram and right breast ultrasound. Appointment scheduled for Thursday, January 26, 2019 at 1110. Patient aware of appointment and will be there.  Courtney Johns verbalized understanding.  Courtney Johns, Arvil Chaco, RN 10:14 AM

## 2019-01-26 NOTE — Progress Notes (Signed)
Complaints of right breast lump and diffuse breast pain x one month. Patient states the pain comes and goes. Patient rates the pain at a 5 out of 10.  Patients BP was elevated at 196/122 right arm, 222/135 left, repeated 5-10 minutes later right arm at 185/119, and the last time 10-15 minutes later at 171/117. Explained importance of follow-up with PCP. Patient called PCP while at [facility] and was advised to come in the office today. Explained the importance of follow-up. Patient verbalized understanding.  Pap Smear: Pap smear not completed today. Last Pap smear was 10/07/2015 at [facility] and normal with negative HPV. Per patient has no history of an abnormal Pap smear. Last Pap smear result is in Epic.  Physical exam: Breasts Breasts symmetrical. No skin abnormalities bilateral breasts. No nipple retraction bilateral breasts. No nipple discharge bilateral breasts. No lymphadenopathy. No lumps palpated left bresat. Palpated a pea sized mobile lump within the right breast at 10 o'clock 8 cm from the nipple. Complaints of right outer breast pain on exam. Referred patient to the [facility] for a diagnostic mammogram and right breast ultrasound. Appointment scheduled for Thursday, January 26, 2019 at 1110.        Pelvic/Bimanual No Pap smear completed today since last Pap smear and HPV typing was 10/07/2015. Pap smear not indicated per [facility]CP guidelines.   Smoking History: Patient has never smoked.  Patient Navigation: Patient education provided. Access to services provided for patient through [facility]CP program.   Breast and Cervical Cancer Risk Assessment: Patient has no family history of breast cancer, known genetic mutations, or radiation treatment to the chest before age 32. Patient has no history of cervical dysplasia, immunocompromised, or DES exposure in-utero.  Risk Assessment    Risk Scores      01/26/2019   Last edited by: Loletta Parish, RN   5-year risk: 0.4 %   Lifetime risk: 7.5 %

## 2019-01-26 NOTE — Progress Notes (Signed)
This encounter was created in error - please disregard.

## 2019-01-27 NOTE — Progress Notes (Signed)
This encounter was created in error - please disregard.

## 2019-03-07 ENCOUNTER — Other Ambulatory Visit: Payer: Self-pay | Admitting: Obstetrics

## 2019-03-07 DIAGNOSIS — Z3041 Encounter for surveillance of contraceptive pills: Secondary | ICD-10-CM

## 2020-01-30 IMAGING — US US BREAST*R* LIMITED INC AXILLA
1 series · 2 of 2 positions shown · non-contrast
Comparison: None.

CLINICAL DATA: 38-year-old female complaining of focal right breast
pain.

EXAM:
DIGITAL DIAGNOSTIC BILATERAL MAMMOGRAM WITH CAD AND TOMO
ULTRASOUND RIGHT BREAST

[Series 1: us breast*right* limited inc axilla · 0.07mm/px · 2 of 2 slices shown]
[im 1/2]
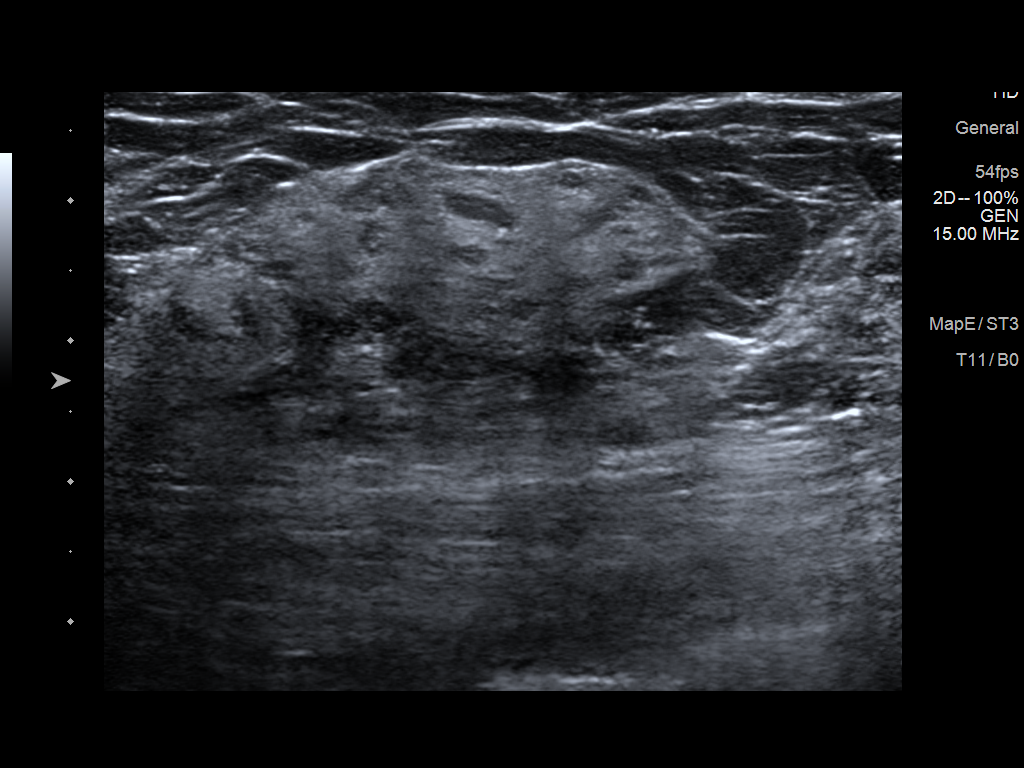
[im 2/2]
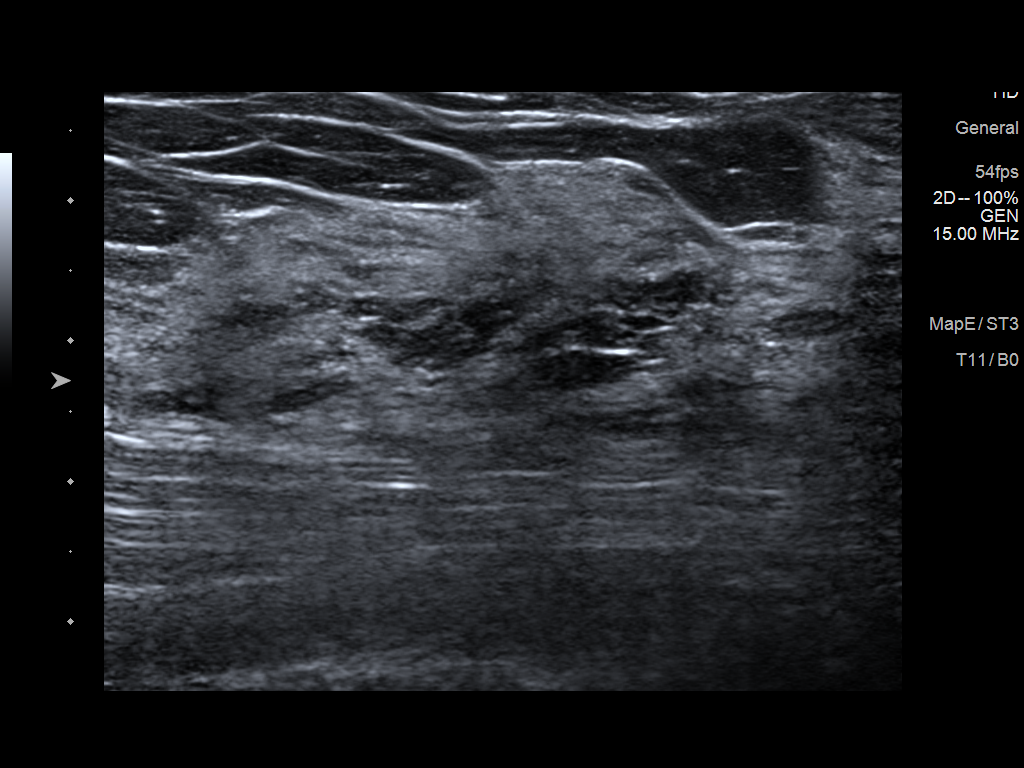

[2 of 2 positions shown; findings below may reference images not displayed]

ACR Breast Density Category c: The breast tissue is heterogeneously
dense, which may obscure small masses.
FINDINGS: No suspicious mass, malignant type microcalcifications or distortion
detected in either breast.

Mammographic images were processed with CAD.

On physical exam, I do not palpate a mass in the area of clinical
concern in the right breast at 10 o'clock 14 cm from the nipple.

Targeted ultrasound is performed, showing normal tissue in the area
of clinical concern in the 10 o'clock region of the right breast 14
cm from the nipple. No solid or cystic mass, abnormal shadowing or
distortion visualized.
IMPRESSION: No evidence of malignancy in either breast.

RECOMMENDATION:
Bilateral screening mammogram in 1 year is recommended.

I have discussed the findings and recommendations with the patient.
If applicable, a reminder letter will be sent to the patient
regarding the next appointment.

BI-RADS CATEGORY  1: Negative.
